# Patient Record
Sex: Female | Born: 1980 | Race: White | Hispanic: No | Marital: Single | Smoking: Current some day smoker
Health system: Southern US, Community
[De-identification: ages and names within clinical notes are randomized; demographics above are authoritative.]

## PROBLEM LIST (undated history)

## (undated) HISTORY — PX: CHOLECYSTECTOMY: SHX55

## (undated) HISTORY — PX: TUBAL LIGATION: SHX77

## (undated) HISTORY — PX: OTHER SURGICAL HISTORY: SHX169

## (undated) HISTORY — PX: COLONOSCOPY: SHX174

## (undated) HISTORY — PX: DILATION AND CURETTAGE OF UTERUS: SHX78

## (undated) HISTORY — PX: KNEE SURGERY: SHX244

## (undated) HISTORY — PX: APPENDECTOMY: SHX54

## (undated) HISTORY — PX: ESOPHAGOGASTRODUODENOSCOPY: SHX1529

---

## 2015-02-11 ENCOUNTER — Emergency Department (HOSPITAL_COMMUNITY): Payer: PRIVATE HEALTH INSURANCE

## 2015-02-11 ENCOUNTER — Emergency Department (HOSPITAL_COMMUNITY)
Admission: EM | Admit: 2015-02-11 | Discharge: 2015-02-11 | Disposition: A | Discharge status: Home / Self Care | Payer: PRIVATE HEALTH INSURANCE | Attending: Emergency Medicine | Admitting: Emergency Medicine

## 2015-02-11 ENCOUNTER — Encounter (HOSPITAL_COMMUNITY): Payer: Self-pay | Admitting: Emergency Medicine

## 2015-02-11 DIAGNOSIS — Y9389 Activity, other specified: Secondary | ICD-10-CM | POA: Diagnosis not present

## 2015-02-11 DIAGNOSIS — Y998 Other external cause status: Secondary | ICD-10-CM | POA: Insufficient documentation

## 2015-02-11 DIAGNOSIS — Y9289 Other specified places as the place of occurrence of the external cause: Secondary | ICD-10-CM | POA: Insufficient documentation

## 2015-02-11 DIAGNOSIS — S1093XA Contusion of unspecified part of neck, initial encounter: Secondary | ICD-10-CM | POA: Insufficient documentation

## 2015-02-11 DIAGNOSIS — S20211A Contusion of right front wall of thorax, initial encounter: Secondary | ICD-10-CM | POA: Insufficient documentation

## 2015-02-11 DIAGNOSIS — R0781 Pleurodynia: Secondary | ICD-10-CM

## 2015-02-11 DIAGNOSIS — T148XXA Other injury of unspecified body region, initial encounter: Secondary | ICD-10-CM

## 2015-02-11 DIAGNOSIS — S1091XA Abrasion of unspecified part of neck, initial encounter: Secondary | ICD-10-CM | POA: Diagnosis not present

## 2015-02-11 DIAGNOSIS — S0012XA Contusion of left eyelid and periocular area, initial encounter: Secondary | ICD-10-CM | POA: Insufficient documentation

## 2015-02-11 DIAGNOSIS — S299XXA Unspecified injury of thorax, initial encounter: Secondary | ICD-10-CM | POA: Diagnosis present
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
## 2015-02-11 MED ORDER — OXYCODONE-ACETAMINOPHEN 5-325 MG PO TABS: 1.0000 | ORAL_TABLET | ORAL | Status: DC | PRN

## 2015-02-11 MED ORDER — OXYCODONE-ACETAMINOPHEN 5-325 MG PO TABS
1.0000 | ORAL_TABLET | Freq: Once | ORAL | Status: AC
  Administered 2015-02-11: 1 via ORAL
  Filled 2015-02-11: qty 1
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
## 2015-02-11 NOTE — Discharge Instructions (Signed)
Abrasion An abrasion is a cut or scrape of the skin. Abrasions do not extend through all layers of the skin and most heal within 10 days. It is important to care for your abrasion properly to prevent infection. CAUSES  Most abrasions are caused by falling on, or gliding across, the ground or other surface. When your skin rubs on something, the outer and inner layer of skin rubs off, causing an abrasion. DIAGNOSIS  Your caregiver will be able to diagnose an abrasion during a physical exam.  TREATMENT  Your treatment depends on how large and deep the abrasion is. Generally, your abrasion will be cleaned with water and a mild soap to remove any dirt or debris. An antibiotic ointment may be put over the abrasion to prevent an infection. A bandage (dressing) may be wrapped around the abrasion to keep it from getting dirty.  You may need a tetanus shot if:  You cannot remember when you had your last tetanus shot.  You have never had a tetanus shot.  The injury broke your skin. If you get a tetanus shot, your arm may swell, get red, and feel warm to the touch. This is common and not a problem. If you need a tetanus shot and you choose not to have one, there is a rare chance of getting tetanus. Sickness from tetanus can be serious.  HOME CARE INSTRUCTIONS   If a dressing was applied, change it at least once a day or as directed by your caregiver. If the bandage sticks, soak it off with warm water.   Wash the area with water and a mild soap to remove all the ointment 2 times a day. Rinse off the soap and pat the area dry with a clean towel.   Reapply any ointment as directed by your caregiver. This will help prevent infection and keep the bandage from sticking. Use gauze over the wound and under the dressing to help keep the bandage from sticking.   Change your dressing right away if it becomes wet or dirty.   Only take over-the-counter or prescription medicines for pain, discomfort, or fever as  directed by your caregiver.   Follow up with your caregiver within 24-48 hours for a wound check, or as directed. If you were not given a wound-check appointment, look closely at your abrasion for redness, swelling, or pus. These are signs of infection. SEEK IMMEDIATE MEDICAL CARE IF:   You have increasing pain in the wound.   You have redness, swelling, or tenderness around the wound.   You have pus coming from the wound.   You have a fever or persistent symptoms for more than 2-3 days.  You have a fever and your symptoms suddenly get worse.  You have a bad smell coming from the wound or dressing.  MAKE SURE YOU:   Understand these instructions.  Will watch your condition.  Will get help right away if you are not doing well or get worse. Document Released: 07/15/2005 Document Revised: 09/21/2012 Document Reviewed: 09/08/2011 [facility] Patient Information 2015 [place], [place]. This information is not intended to replace advice given to you by your health care provider. Make sure you discuss any questions you have with your health care provider.  Chest Pain (Nonspecific) It is often hard to give a specific diagnosis for the cause of chest pain. There is always a chance that your pain could be related to something serious, such as a heart attack or a blood clot in the lungs. You  need to follow up with your health care provider for further evaluation. CAUSES   Heartburn.  Pneumonia or bronchitis.  Anxiety or stress.  Inflammation around your heart (pericarditis) or lung (pleuritis or pleurisy).  A blood clot in the lung.  A collapsed lung (pneumothorax). It can develop suddenly on its own (spontaneous pneumothorax) or from trauma to the chest.  Shingles infection (herpes zoster virus). The chest wall is composed of bones, muscles, and cartilage. Any of these can be the source of the pain.  The bones can be bruised by injury.  The muscles or cartilage can be strained by  coughing or overwork.  The cartilage can be affected by inflammation and become sore (costochondritis). DIAGNOSIS  Lab tests or other studies may be needed to find the cause of your pain. Your health care provider may have you take a test called an ambulatory electrocardiogram (ECG). An ECG records your heartbeat patterns over a 24-hour period. You may also have other tests, such as:  Transthoracic echocardiogram (TTE). During echocardiography, sound waves are used to evaluate how blood flows through your heart.  Transesophageal echocardiogram (TEE).  Cardiac monitoring. This allows your health care provider to monitor your heart rate and rhythm in real time.  Holter monitor. This is a portable device that records your heartbeat and can help diagnose heart arrhythmias. It allows your health care provider to track your heart activity for several days, if needed.  Stress tests by exercise or by giving medicine that makes the heart beat faster. TREATMENT   Treatment depends on what may be causing your chest pain. Treatment may include:  Acid blockers for heartburn.  Anti-inflammatory medicine.  Pain medicine for inflammatory conditions.  Antibiotics if an infection is present.  You may be advised to change lifestyle habits. This includes stopping smoking and avoiding alcohol, caffeine, and chocolate.  You may be advised to keep your head raised (elevated) when sleeping. This reduces the chance of acid going backward from your stomach into your esophagus. Most of the time, nonspecific chest pain will improve within 2-3 days with rest and mild pain medicine.  HOME CARE INSTRUCTIONS   If antibiotics were prescribed, take them as directed. Finish them even if you start to feel better.  For the next few days, avoid physical activities that bring on chest pain. Continue physical activities as directed.  Do not use any tobacco products, including cigarettes, chewing tobacco, or electronic  cigarettes.  Avoid drinking alcohol.  Only take medicine as directed by your health care provider.  Follow your health care provider's suggestions for further testing if your chest pain does not go away.  Keep any follow-up appointments you made. If you do not go to an appointment, you could develop lasting (chronic) problems with pain. If there is any problem keeping an appointment, call to reschedule. SEEK MEDICAL CARE IF:   Your chest pain does not go away, even after treatment.  You have a rash with blisters on your chest.  You have a fever. SEEK IMMEDIATE MEDICAL CARE IF:   You have increased chest pain or pain that spreads to your arm, neck, jaw, back, or abdomen.  You have shortness of breath.  You have an increasing cough, or you cough up blood.  You have severe back or abdominal pain.  You feel nauseous or vomit.  You have severe weakness.  You faint.  You have chills. This is an emergency. Do not wait to see if the pain will go away. Get  medical help at once. Call your local emergency services (911 in [place] Do not drive yourself to the hospital. MAKE SURE YOU:   Understand these instructions.  Will watch your condition.  Will get help right away if you are not doing well or get worse. Document Released: 07/15/2005 Document Revised: 10/10/2013 Document Reviewed: 05/10/2008 [facility] Patient Information 2015 [place]. This information is not intended to replace advice given to you by your health care provider. Make sure you discuss any questions you have with your health care provider.
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
## 2021-10-24 ENCOUNTER — Emergency Department: Payer: BLUE CROSS/BLUE SHIELD

## 2021-10-24 ENCOUNTER — Inpatient Hospital Stay
Admission: EM | Admit: 2021-10-24 | Discharge: 2021-10-26 | DRG: 069 | Disposition: A | Discharge status: Home / Self Care | Payer: BLUE CROSS/BLUE SHIELD | Attending: Internal Medicine | Admitting: Internal Medicine

## 2021-10-24 ENCOUNTER — Encounter: Payer: Self-pay | Admitting: Emergency Medicine

## 2021-10-24 ENCOUNTER — Other Ambulatory Visit: Payer: Self-pay

## 2021-10-24 DIAGNOSIS — R2990 Unspecified symptoms and signs involving the nervous system: Secondary | ICD-10-CM | POA: Diagnosis not present

## 2021-10-24 DIAGNOSIS — R29702 NIHSS score 2: Secondary | ICD-10-CM | POA: Diagnosis present

## 2021-10-24 DIAGNOSIS — I959 Hypotension, unspecified: Secondary | ICD-10-CM | POA: Diagnosis not present

## 2021-10-24 DIAGNOSIS — G459 Transient cerebral ischemic attack, unspecified: Secondary | ICD-10-CM | POA: Diagnosis not present

## 2021-10-24 DIAGNOSIS — R202 Paresthesia of skin: Secondary | ICD-10-CM | POA: Diagnosis not present

## 2021-10-24 DIAGNOSIS — Z720 Tobacco use: Secondary | ICD-10-CM | POA: Diagnosis present

## 2021-10-24 DIAGNOSIS — Z6841 Body Mass Index (BMI) 40.0 and over, adult: Secondary | ICD-10-CM

## 2021-10-24 DIAGNOSIS — Z79899 Other long term (current) drug therapy: Secondary | ICD-10-CM

## 2021-10-24 DIAGNOSIS — I10 Essential (primary) hypertension: Secondary | ICD-10-CM | POA: Diagnosis present

## 2021-10-24 DIAGNOSIS — R29810 Facial weakness: Secondary | ICD-10-CM

## 2021-10-24 DIAGNOSIS — Z8616 Personal history of COVID-19: Secondary | ICD-10-CM

## 2021-10-24 DIAGNOSIS — R200 Anesthesia of skin: Secondary | ICD-10-CM | POA: Diagnosis present

## 2021-10-24 DIAGNOSIS — Z833 Family history of diabetes mellitus: Secondary | ICD-10-CM

## 2021-10-24 DIAGNOSIS — G510 Bell's palsy: Secondary | ICD-10-CM | POA: Diagnosis present

## 2021-10-24 DIAGNOSIS — Z882 Allergy status to sulfonamides status: Secondary | ICD-10-CM

## 2021-10-24 DIAGNOSIS — Z8249 Family history of ischemic heart disease and other diseases of the circulatory system: Secondary | ICD-10-CM

## 2021-10-24 DIAGNOSIS — Z888 Allergy status to other drugs, medicaments and biological substances status: Secondary | ICD-10-CM

## 2021-10-24 DIAGNOSIS — E66813 Obesity, class 3: Secondary | ICD-10-CM

## 2021-10-24 DIAGNOSIS — F17210 Nicotine dependence, cigarettes, uncomplicated: Secondary | ICD-10-CM | POA: Diagnosis present

## 2021-10-24 LAB — CBC
HCT: 40.4 % (ref 36.0–46.0)
Hemoglobin: 13.1 g/dL (ref 12.0–15.0)
MCH: 30.3 pg (ref 26.0–34.0)
MCHC: 32.4 g/dL (ref 30.0–36.0)
MCV: 93.5 fL (ref 80.0–100.0)
Platelets: 292 10*3/uL (ref 150–400)
RBC: 4.32 MIL/uL (ref 3.87–5.11)
RDW: 12.5 % (ref 11.5–15.5)
WBC: 9.6 10*3/uL (ref 4.0–10.5)
nRBC: 0 % (ref 0.0–0.2)

## 2021-10-24 LAB — COMPREHENSIVE METABOLIC PANEL
ALT: 26 U/L (ref 0–44)
AST: 22 U/L (ref 15–41)
Albumin: 3.9 g/dL (ref 3.5–5.0)
Alkaline Phosphatase: 58 U/L (ref 38–126)
Anion gap: 8 (ref 5–15)
BUN: 15 mg/dL (ref 6–20)
CO2: 19 mmol/L — ABNORMAL LOW (ref 22–32)
Calcium: 8.7 mg/dL — ABNORMAL LOW (ref 8.9–10.3)
Chloride: 108 mmol/L (ref 98–111)
Creatinine, Ser: 0.81 mg/dL (ref 0.44–1.00)
GFR, Estimated: >60 mL/min (ref >60)
Glucose, Bld: 108 mg/dL — ABNORMAL HIGH (ref 70–99)
Potassium: 3.4 mmol/L — ABNORMAL LOW (ref 3.5–5.1)
Sodium: 135 mmol/L (ref 135–145)
Total Bilirubin: 0.5 mg/dL (ref 0.3–1.2)
Total Protein: 7.2 g/dL (ref 6.5–8.1)

## 2021-10-24 LAB — PROTIME-INR
INR: 1 (ref 0.8–1.2)
Prothrombin Time: 13.6 seconds (ref 11.4–15.2)

## 2021-10-24 LAB — DIFFERENTIAL
Abs Immature Granulocytes: 0.07 10*3/uL (ref 0.00–0.07)
Basophils Absolute: 0 10*3/uL (ref 0.0–0.1)
Basophils Relative: 0 %
Eosinophils Absolute: 0.1 10*3/uL (ref 0.0–0.5)
Eosinophils Relative: 1 %
Immature Granulocytes: 1 %
Lymphocytes Relative: 36 %
Lymphs Abs: 3.5 10*3/uL (ref 0.7–4.0)
Monocytes Absolute: 0.7 10*3/uL (ref 0.1–1.0)
Monocytes Relative: 8 %
Neutro Abs: 5.2 10*3/uL (ref 1.7–7.7)
Neutrophils Relative %: 54 %

## 2021-10-24 LAB — APTT: aPTT: 24 seconds (ref 24–36)

## 2021-10-24 LAB — CBG MONITORING, ED: Glucose-Capillary: 107 mg/dL — ABNORMAL HIGH (ref 70–99)

## 2021-10-24 IMAGING — CT CT HEAD CODE STROKE
4 series · 17 of 47 positions shown, 19 images · non-contrast
Comparison: None.

CLINICAL DATA: Code stroke.  Facial droop

EXAM:
CT HEAD WITHOUT CONTRAST
TECHNIQUE: Contiguous axial images were obtained from the base of the skull
through the vertex without intravenous contrast.

[Series 4: head wo · axial · 0.47mm/px · z∈[+402,+522]mm · 7 of 34 slices shown, 9 images]
[im 5/34  brain]
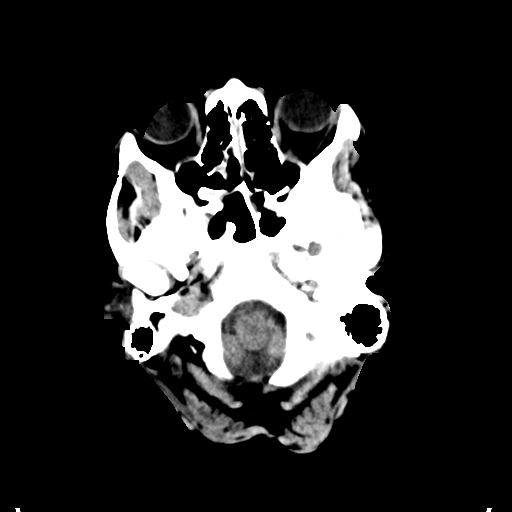
[im 5/34  bone]
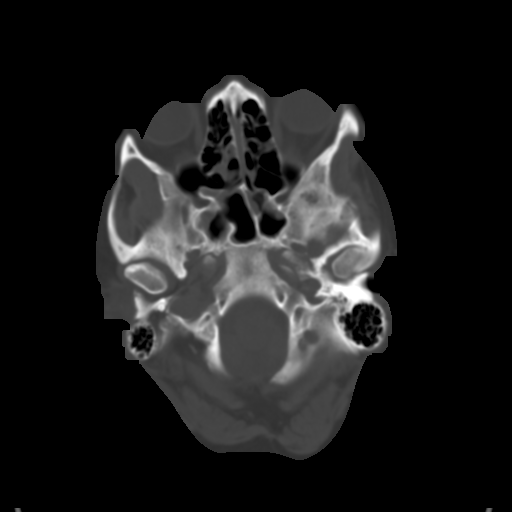
[im 9/34  brain]
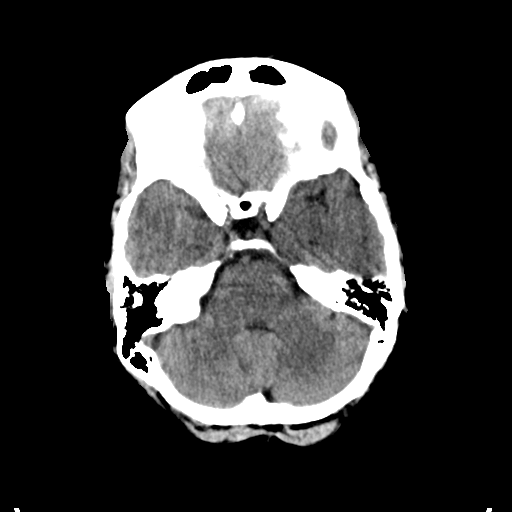
[im 13/34  brain]
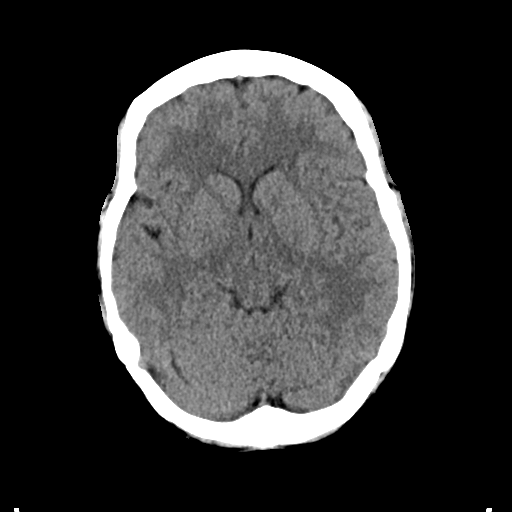
[im 17/34  brain]
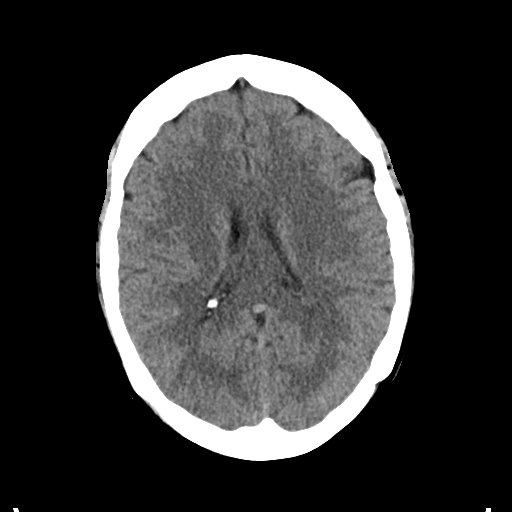
[im 21/34  brain]
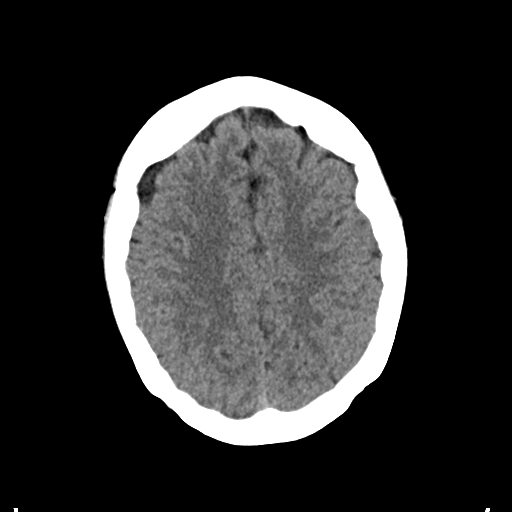
[im 21/34  bone]
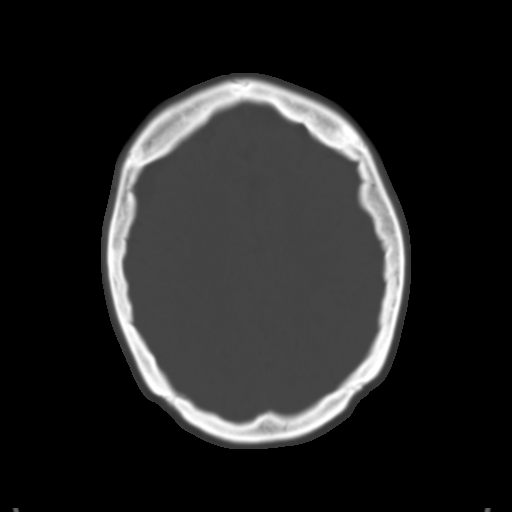
[im 25/34  brain]
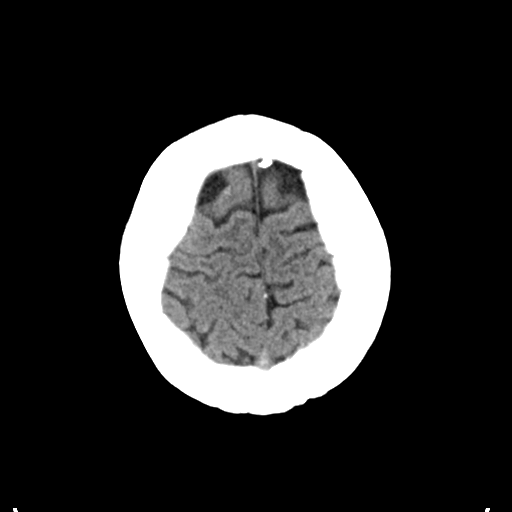
[im 29/34  brain]
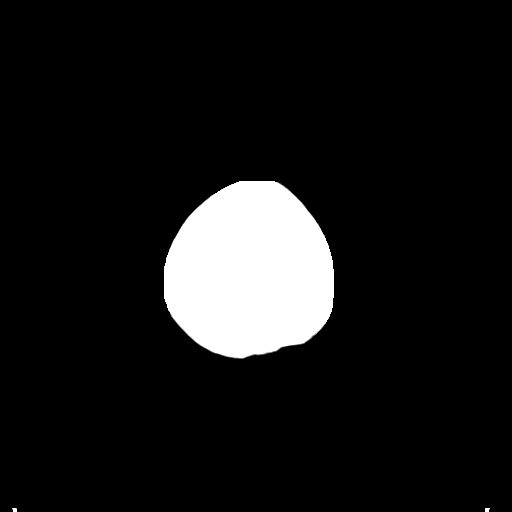

[Series 5: head bone · axial · 0.47mm/px · z∈[+398,+454]mm · 4 of 83 slices shown]
[im 9/83  bone]
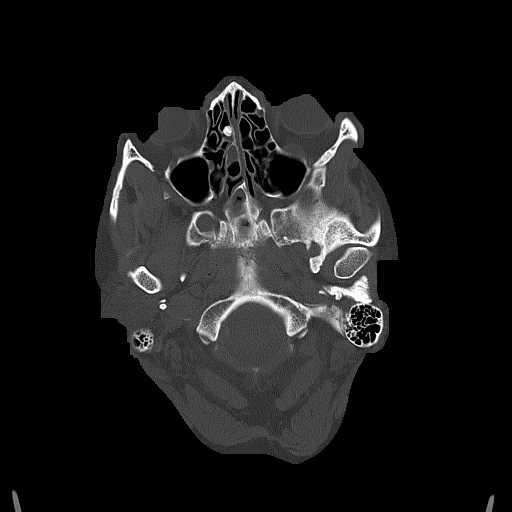
[im 17/83  bone]
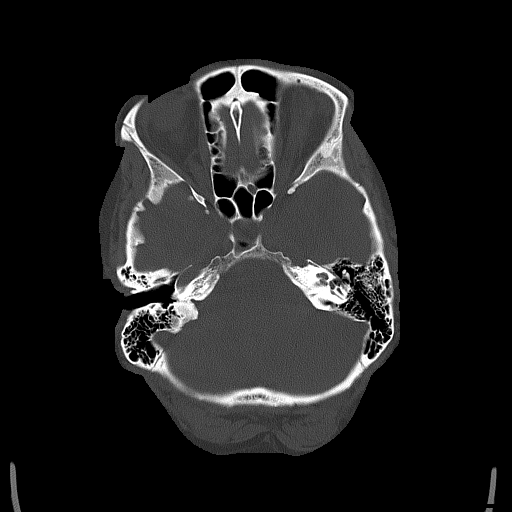
[im 25/83  bone]
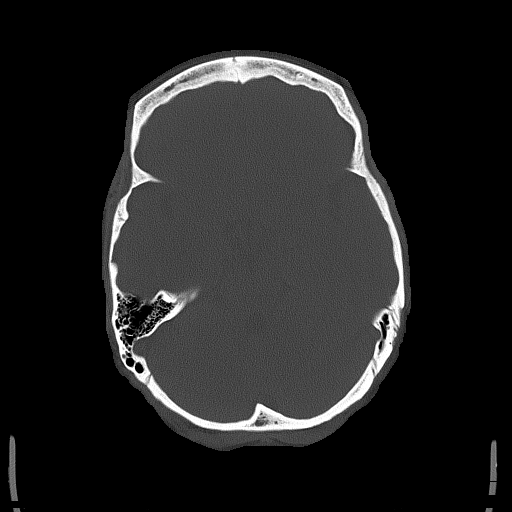
[im 37/83  bone]
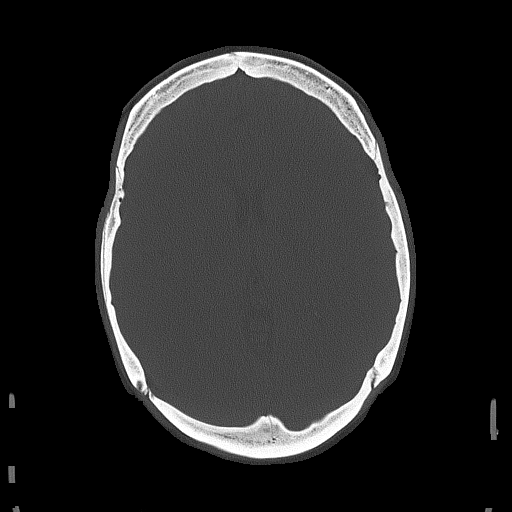

[Series 6: coronal soft tissue · coronal · 0.34mm/px · 3 of 70 slices shown]
[im 24/70  brain]
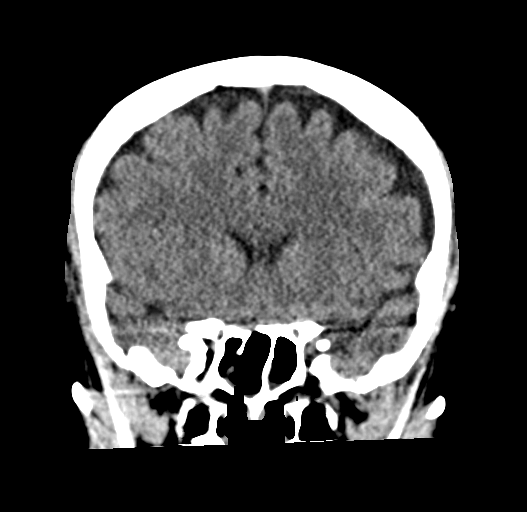
[im 31/70  brain]
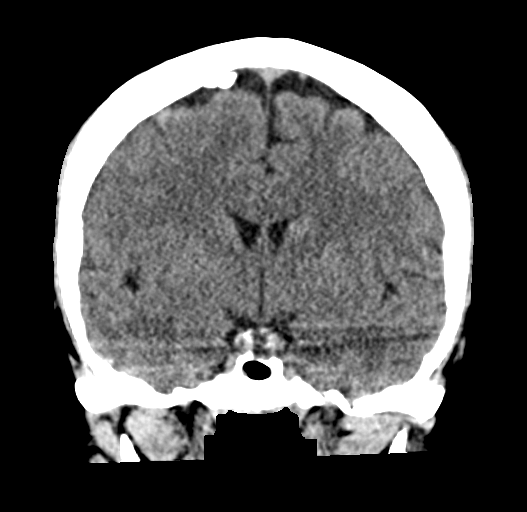
[im 39/70  brain]
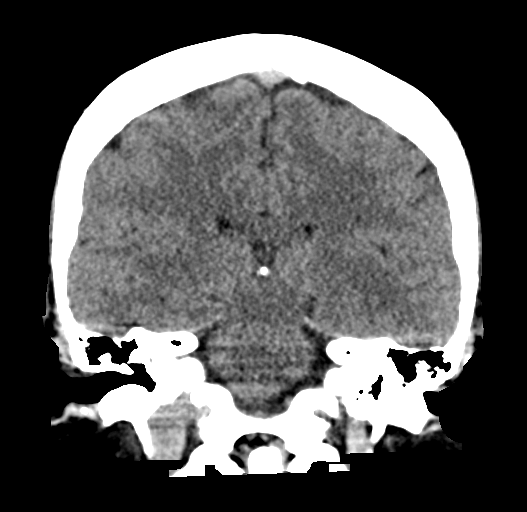

[Series 7: sagittal soft tissue · sagittal · 0.34mm/px · 3 of 60 slices shown]
[im 20/60  brain]
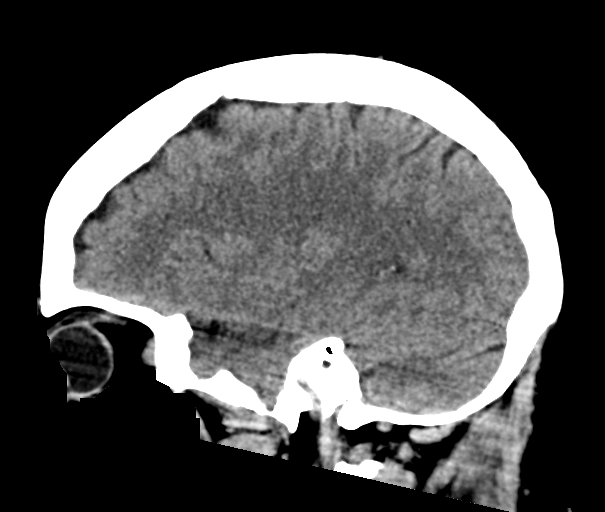
[im 30/60  brain]
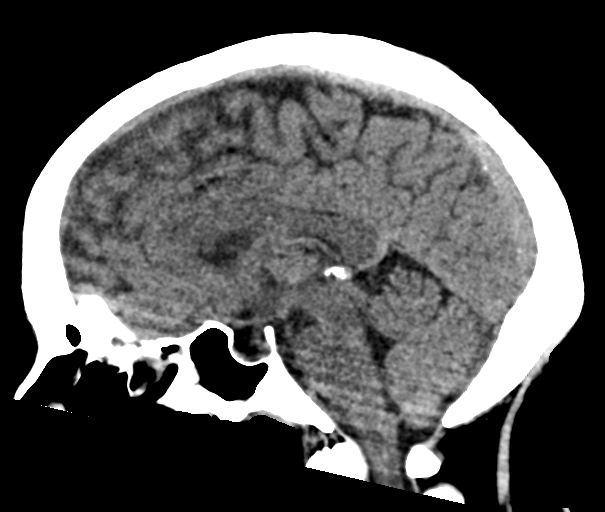
[im 40/60  brain]
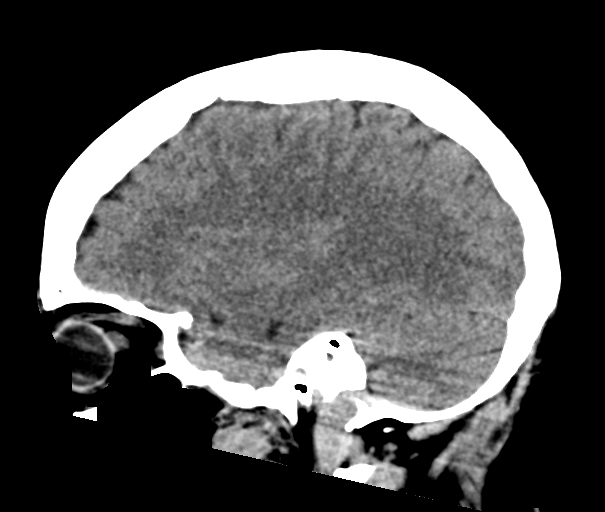

[17 of 47 positions shown; findings below may reference images not displayed]

FINDINGS: Brain: There is no mass, hemorrhage or extra-axial collection. The
size and configuration of the ventricles and extra-axial CSF spaces
are normal. The brain parenchyma is normal, without evidence of
acute or chronic infarction.

Vascular: No abnormal hyperdensity of the major intracranial
arteries or dural venous sinuses. No intracranial atherosclerosis.

Skull: The visualized skull base, calvarium and extracranial soft
tissues are normal.

Sinuses/Orbits: No fluid levels or advanced mucosal thickening of
the visualized paranasal sinuses. No mastoid or middle ear effusion.
The orbits are normal.

ASPECTS (Alberta Stroke Program Early CT Score)

- Ganglionic level infarction (caudate, lentiform nuclei, internal
capsule, insula, M1-M3 cortex): 7

- Supraganglionic infarction (M4-M6 cortex): 3

Total score (0-10 with 10 being normal): 10
IMPRESSION: 1. Normal head CT.
2. ASPECTS is 10.

These results were called by telephone at the time of interpretation
on [DATE] at [DATE] to provider ELUOZO , who verbally
acknowledged these results.

## 2021-10-24 MED ORDER — ASPIRIN 81 MG PO CHEW
324.0000 mg | CHEWABLE_TABLET | Freq: Once | ORAL | Status: AC
  Administered 2021-10-25: 324 mg via ORAL
  Filled 2021-10-24: qty 4

## 2021-10-24 NOTE — H&P (Signed)
History and Physical    Kayla [name] [id number] DOB: [birth year] DOA: 10/24/2021  PCP: Dian Situ, MD    Patient coming from:  Home   Chief Complaint:  Right-sided  facial numbness  - Code stroke.   HPI:  Kayla [name] is a 41 y.o. female seen in ed with complaints of right-sided facial  paresthesia that started about 8:30 PM today.  Patient also complains of a headache and had Covid positive on 1225.the heaviness affected rt eye and rt upper and lower face. It has been constant since it started. No aggravating or alleviating factors. D/w pt about  tobacco cessation. It is constant not intermittent. No associated symptoms. No weakness.  Patient denies any blurry vision dizziness chest pain palpitation shortness of breath gait speech or difficulty ambulating.  Headache currently is 6 out of 10 and patient has taken Tylenol for it and has improved.   Pt has past medical history of tobacco abuse, hypertension, allergy to sulfa and Levaquin.  ED Course:   Vitals:   10/24/21 2212 10/24/21 2230 10/24/21 2300 10/24/21 2330  BP:  (!) 119/93 (!) 146/103 (!) 134/94  Pulse:  82 91 80  Resp:  19 16 20   Temp: 98.3 F (36.8 C)     TempSrc: Oral     SpO2:  99% 99% 100%  Weight: 121.1 kg     Height: 5\' 8"  (1.727 m)     In the emergency room patient is alert awake oriented vitals are stable, patient received full dose aspirin.  Head CT negative for any acute findings.  Review of Systems:  Review of Systems  Neurological:  Positive for sensory change and headaches.  All other systems reviewed and are negative.   History reviewed. No pertinent past medical history.  Past Surgical History:  Procedure Laterality Date   APPENDECTOMY     CHOLECYSTECTOMY     COLONOSCOPY     DILATION AND CURETTAGE OF UTERUS     ESOPHAGOGASTRODUODENOSCOPY     KNEE SURGERY     TUBAL LIGATION     uterine ablation       reports that she has been smoking cigarettes. She does not have any smokeless tobacco  history on file. She reports current alcohol use. No history on file for drug use.  Allergies  Allergen Reactions   Levaquin [Levofloxacin] Other (See Comments)    paranoid   Sulfa Antibiotics Rash    Family History  Problem Relation Age of Onset   Diabetes Mother    Hypertension Mother     Prior to Admission medications   Medication Sig Start Date End Date Taking? Authorizing Provider  acetaminophen (TYLENOL) 500 MG tablet Take 1,000 mg by mouth every 8 (eight) hours as needed for mild pain.   Yes [provider]  albuterol (VENTOLIN HFA) 108 (90 Base) MCG/ACT inhaler Inhale 2 puffs into the lungs every 6 (six) hours as needed for wheezing or shortness of breath.   Yes [provider]  ibuprofen (ADVIL) 200 MG tablet Take 800 mg by mouth every 6 (six) hours as needed for moderate pain.   Yes [provider]  meclizine (ANTIVERT) 25 MG tablet Take 25 mg by mouth 3 (three) times daily as needed for dizziness. 10/15/21 10/15/22 Yes [provider]  hydrochlorothiazide (HYDRODIURIL) 25 MG tablet Take 25 mg by mouth daily. Patient not taking: Reported on 10/24/2021    [provider]  lisinopril (ZESTRIL) 20 MG tablet Take 20 mg by mouth daily. Patient not  taking: Reported on 10/24/2021    [provider]  Nirmatrelvir-Ritonavir (PAXLOVID, 300/100, PO) Take 3 tablets by mouth as directed. Patient not taking: Reported on 10/24/2021 10/14/21   [provider]  oxyCODONE-acetaminophen (PERCOCET/ROXICET) 5-325 MG per tablet Take 1-2 tablets by mouth every 4 (four) hours as needed for severe pain. Patient not taking: Reported on 10/24/2021 02/11/15   Glendell Docker, NP    Physical Exam: Vitals:   10/24/21 2212 10/24/21 2230 10/24/21 2300 10/24/21 2330  BP:  (!) 119/93 (!) 146/103 (!) 134/94  Pulse:  82 91 80  Resp:  19 16 20   Temp: 98.3 F (36.8 C)     TempSrc: Oral     SpO2:  99% 99% 100%  Weight: 121.1 kg     Height: 5\' 8"   (1.727 m)      Physical Exam Vitals reviewed.  Constitutional:      General: She is not in acute distress.    Appearance: She is not ill-appearing.  HENT:     Head: Normocephalic and atraumatic. No right periorbital erythema or left periorbital erythema.      Right Ear: Hearing, tympanic membrane, ear canal and external ear normal. No decreased hearing noted. No drainage, swelling or tenderness. No middle ear effusion. There is no impacted cerumen. No foreign body. No PE tube. No hemotympanum. Tympanic membrane is not injected or scarred.     Left Ear: Hearing, tympanic membrane, ear canal and external ear normal. No decreased hearing noted. No drainage, swelling or tenderness.  No middle ear effusion. There is no impacted cerumen. No foreign body. No PE tube. No hemotympanum. Tympanic membrane is not injected or scarred.     Nose: Nose normal.     Mouth/Throat:     Mouth: Mucous membranes are moist.     Tongue: No lesions. Tongue does not deviate from midline.  Eyes:     Extraocular Movements: Extraocular movements intact.     Pupils: Pupils are equal, round, and reactive to light.  Neck:     Vascular: No carotid bruit.  Cardiovascular:     Rate and Rhythm: Normal rate and regular rhythm.     Pulses: Normal pulses.     Heart sounds: Normal heart sounds.  Pulmonary:     Effort: Pulmonary effort is normal.     Breath sounds: Normal breath sounds.  Abdominal:     General: Bowel sounds are normal. There is no distension.     Palpations: Abdomen is soft. There is no mass.     Tenderness: There is no abdominal tenderness. There is no guarding.     Hernia: No hernia is present.  Musculoskeletal:     Right lower leg: No edema.     Left lower leg: No edema.  Skin:    General: Skin is warm.  Neurological:     General: No focal deficit present.     Mental Status: She is alert and oriented to person, place, and time.  Psychiatric:        Mood and Affect: Mood normal.        Behavior:  Behavior normal.    Labs on Admission: I have personally reviewed following labs and imaging studies  No results for input(s): CKTOTAL, CKMB, TROPONINI in the last 72 hours. Lab Results  Component Value Date   WBC 9.6 10/24/2021   HGB 13.1 10/24/2021   HCT 40.4 10/24/2021   MCV 93.5 10/24/2021   PLT 292 10/24/2021    Recent Labs  Lab 10/24/21 2153  NA 135  K 3.4*  CL 108  CO2 19*  BUN 15  CREATININE 0.81  CALCIUM 8.7*  PROT 7.2  BILITOT 0.5  ALKPHOS 58  ALT 26  AST 22  GLUCOSE 108*   No results found for: CHOL, HDL, LDLCALC, TRIG No results found for: DDIMER Invalid input(s): POCBNP   COVID-19 Labs No results for input(s): DDIMER, FERRITIN, LDH, CRP in the last 72 hours. Lab Results  Component Value Date   SARSCOV2NAA POSITIVE (A) 10/24/2021    Radiological Exams on Admission: CT HEAD CODE STROKE WO CONTRAST  Result Date: 10/24/2021 CLINICAL DATA:  Code stroke.  Facial droop EXAM: CT HEAD WITHOUT CONTRAST TECHNIQUE: Contiguous axial images were obtained from the base of the skull through the vertex without intravenous contrast. COMPARISON:  None. FINDINGS: Brain: There is no mass, hemorrhage or extra-axial collection. The size and configuration of the ventricles and extra-axial CSF spaces are normal. The brain parenchyma is normal, without evidence of acute or chronic infarction. Vascular: No abnormal hyperdensity of the major intracranial arteries or dural venous sinuses. No intracranial atherosclerosis. Skull: The visualized skull base, calvarium and extracranial soft tissues are normal. Sinuses/Orbits: No fluid levels or advanced mucosal thickening of the visualized paranasal sinuses. No mastoid or middle ear effusion. The orbits are normal. ASPECTS [facility] Stroke Program Early CT Score) - Ganglionic level infarction (caudate, lentiform nuclei, internal capsule, insula, M1-M3 cortex): 7 - Supraganglionic infarction (M4-M6 cortex): 3 Total score (0-10 with 10 being  normal): 10 IMPRESSION: 1. Normal head CT. 2. ASPECTS is 10. These results were called by telephone at the time of interpretation on 10/24/2021 at 9:51 pm to provider PHILLIP STAFFORD , who verbally acknowledged these results. Electronically Signed   By: Deatra Robinson M.D.   On: 10/24/2021 21:51    EKG: Independently reviewed.  Sinus rhythm 83 with normal axis.   Assessment/Plan: Principal Problem:   Facial paresthesia Active Problems:   Essential hypertension   Tobacco abuse   Facial paresthesia: -We will admit pt to med tele floor.  -We will continue asa 325 mg and statin.  -Pt denies any weakness , and no droop on exam currently.  -Per neurology consult recommendations: -MRI brain Noncontrast.  -Neurochecks, bedside swallow, aspiration, fall precautions. -Aspirin 325 milligrams daily. -Lipitor 80 mg x 1. -PT/OT/ Bedside swallow.  -Permissive HTN-SBP 220/ 120's ,labetalol PO or IV or Vasotec IV with a goal of 15% reduction in BP during the first 24 hours. - a1c and thyroid studies, am lipid panel.   HTN: D/w pt about permissive htn and need for NO BP MEDS WITH GOAL OF SBP TO BE IN 200'S.   Tobacco abuse: Nicotine patch.  Counseled pt on tobacco cessation.     DVT prophylaxis:  Heparin  Code Status:  Full code  Family Communication:  Vidette, Wilkinson (Mother)  [phone] (Home Phone)   Disposition Plan:  To be determined  Consults called:  Neurology  Admission status: Inpatient   Gertha Calkin MD [facility]  6 PM- 2 AM. Please contact me via secure Chat 6 PM-2 AM. How to contact the [facility] Attending or Consulting provider 7A - 7P or covering provider during after hours 7P -7A, for this patient.   Check the care team in [facility] and look for a) attending/consulting [facility] provider listed and b) the [facility] team listed Log into [email or web address] and use [place]'s universal password to access. If you do not have the password, please contact the hospital  operator. Locate the [facility] provider you are looking for under [facility] and page to a number that you can be directly reached. If you still have difficulty reaching the provider, please page the [facility] (Director on Call) for the Hospitalists listed on amion for assistance. [email or web address] 10/25/2021, 1:20 AM

## 2021-10-24 NOTE — ED Triage Notes (Signed)
Pt c/o right sided facial droop and numbness. Pt c/o headache today. Covid + 12/25

## 2021-10-24 NOTE — ED Notes (Signed)
CALLED CARELINK  (RUBY) FOR CODE STROKE

## 2021-10-24 NOTE — ED Notes (Addendum)
Pt reported teeth and mouth are hurting - neurologist RN on tele-cart and neurologist informed.

## 2021-10-24 NOTE — Progress Notes (Addendum)
°   10/24/21 2147  Clinical Encounter Type  Visited With Health care provider   Chaplain Burris called ED desk to check-in regarding plan of care and to assess family presence. Will f/u with Pt post-CT.

## 2021-10-24 NOTE — ED Provider Notes (Signed)
[facility] Provider Note    Event Date/Time   First MD Initiated Contact with Patient 10/24/21 2200     (approximate)   History   Code Stroke   HPI  Juley Hopson is a 41 y.o. female who reports sudden onset of right-sided facial weakness and paresthesia that started at 8:30 PM tonight.  It is been constant since then.  No recent trauma.  She did recently have COVID 2 weeks ago and has had frequent recurrent generalized headaches since then.  Denies blurry vision no double vision.  Denies any extremity weakness or paresthesia.     Physical Exam   Triage Vital Signs: ED Triage Vitals [10/24/21 2140]  Enc Vitals Group     BP (!) 165/108     Pulse Rate (!) 114     Resp 18     Temp      Temp src      SpO2 97 %     Weight      Height      Head Circumference      Peak Flow      Pain Score      Pain Loc      Pain Edu?      Excl. in [place]?     Most recent vital signs: Vitals:   10/24/21 2212 10/24/21 2230  BP:  (!) 119/93  Pulse:  82  Resp:  19  Temp: 98.3 F (36.8 C)   SpO2:  99%     General: Awake, no distress.  CV:  Good peripheral perfusion.  Regular rate and rhythm Resp:  Normal effort.  Abd:  No distention.  Other:  There is droop of the right corner of the mouth at rest but symmetric smile.  Symmetric eyebrow raise.  Symmetric eye closure.  No extremity drift.   ED Results / Procedures / Treatments   Labs (all labs ordered are listed, but only abnormal results are displayed) Labs Reviewed  COMPREHENSIVE METABOLIC PANEL - Abnormal; Notable for the following components:      Result Value   Potassium 3.4 (*)    CO2 19 (*)    Glucose, Bld 108 (*)    Calcium 8.7 (*)    All other components within normal limits  CBG MONITORING, ED - Abnormal; Notable for the following components:   Glucose-Capillary 107 (*)    All other components within normal limits  RESP PANEL BY RT-PCR (FLU A&B, COVID) ARPGX2  PROTIME-INR  APTT  CBC   DIFFERENTIAL  URINE DRUG SCREEN, QUALITATIVE ([facility] ONLY)  URINALYSIS, ROUTINE W REFLEX MICROSCOPIC  ETHANOL  POC URINE PREG, ED     EKG  Interpreted by me Normal sinus rhythm rate of 83.  Normal axis and intervals.  Normal QRS ST segments and T waves.   RADIOLOGY CT head viewed and interpreted by me, no intracranial hemorrhage.  Radiology report reviewed.  Findings discussed with radiologist as well.    PROCEDURES:  Critical Care performed: No  Procedures   MEDICATIONS ORDERED IN ED: Medications - No data to display   IMPRESSION / MDM / [place] / ED COURSE  I reviewed the triage vital signs and the nursing notes.                              Differential diagnosis includes, but is not limited to, intracranial hemorrhage, ischemic stroke, mild Bell's palsy, electrolyte abnormality  Patient presents with right facial weakness and paresthesia for the past 1.5 hours.  I feel that she is not a tPA candidate due to low NIH stroke scale score.  Will obtain stat teleneurology consult.  Clinical Course as of 10/24/21 2317  Ludwig Clarks Oct 24, 2021  2204 Fingerstick blood glucose is normal. [PS]    Clinical Course User Index [PS] Carrie Mew, MD    ----------------------------------------- 11:16 PM on 10/24/2021 ----------------------------------------- CBC is normal with normal hemoglobin.  Chemistry panel is unremarkable.  Presenting symptoms discussed with neurology who has evaluated the patient.  Currently awaiting their recommendations.   FINAL CLINICAL IMPRESSION(S) / ED DIAGNOSES   Final diagnoses:  Facial weakness     Rx / DC Orders   ED Discharge Orders     None        Note:  This document was prepared using Dragon voice recognition software and may include unintentional dictation errors.   Carrie Mew, MD 10/24/21 2317

## 2021-10-24 NOTE — ED Notes (Addendum)
Neurologist on telecart (Dr. Fayrene Helper) to examine patient.

## 2021-10-24 NOTE — ED Notes (Addendum)
Tele neuro cart in room and alerted by RN

## 2021-10-24 NOTE — ED Provider Notes (Signed)
----------------------------------------- °  11:13 PM on 10/24/2021 -----------------------------------------  Assuming care from Dr. Joni Fears.  In short, Kayla [name] is a 40 y.o. female with a chief complaint of facial numbness/droop.  Refer to the original H&P for additional details.  The current plan of care is to follow up with teleneurology.  Workup thus far has been reassuring, but not clear explanation of symptoms yet exists.   ----------------------------------------- 11:48 PM on 10/24/2021 -----------------------------------------  I discussed the case by phone with the telemetry neurologist.  She is having computer problems and is working with [facility] [employer] support, but currently she cannot put in a note.  However she explained to me that after evaluating the patient, she is concerned about the presence of central facial numbness/droop as well as the numbness of the right arm and leg.  She recommends a full dose aspirin and admission for full stroke work-up.  No additional ED interventions at this time.  I am consulting the hospitalist service for admission.  I also personally discussed the case and the plan with the patient.  She is awake, alert, oriented, and reports that her face feels "funny" and is still feeling numb but she has had no significant change in her symptoms.   ----------------------------------------- 12:12 AM on 10/25/2021 -----------------------------------------  Spoke by phone with Dr. Florina Ou with the hospitalist service.  We discussed the case and she agreed to admit the patient for further stroke workup as recommended by neurologist.   Hinda Kehr, MD 10/25/21 [phone]

## 2021-10-24 NOTE — ED Notes (Addendum)
Last normal approx. 8:30/9pm per patient. No dizziness or blurry vision reported. Patient reports has had headache since about 2 pm and headache pain level is 6/10. She describes it as "all over." Pt took Tylenol around 8 pm for headache and has improved. No numbness or weakness reported in extremities. Patient has hx: HTN and has not been on medications since approx 06/2021 due to change in insurance. Pt is a smoker and drinks rarely.  No previous neurologic activity or strokes.  Patient previously Covid positive 10/12/2021.

## 2021-10-24 NOTE — ED Notes (Signed)
Consulting civil engineer and ED secretary informed calling CODE STROKE on pt

## 2021-10-25 ENCOUNTER — Inpatient Hospital Stay: Payer: BLUE CROSS/BLUE SHIELD

## 2021-10-25 ENCOUNTER — Encounter: Payer: Self-pay | Admitting: Internal Medicine

## 2021-10-25 ENCOUNTER — Inpatient Hospital Stay (HOSPITAL_COMMUNITY)
Admit: 2021-10-25 | Discharge: 2021-10-25 | Disposition: A | Discharge status: Home / Self Care | Payer: BLUE CROSS/BLUE SHIELD | Attending: Internal Medicine | Admitting: Internal Medicine

## 2021-10-25 DIAGNOSIS — Z79899 Other long term (current) drug therapy: Secondary | ICD-10-CM | POA: Diagnosis not present

## 2021-10-25 DIAGNOSIS — R29702 NIHSS score 2: Secondary | ICD-10-CM | POA: Diagnosis present

## 2021-10-25 DIAGNOSIS — I959 Hypotension, unspecified: Secondary | ICD-10-CM | POA: Diagnosis not present

## 2021-10-25 DIAGNOSIS — I10 Essential (primary) hypertension: Secondary | ICD-10-CM | POA: Diagnosis present

## 2021-10-25 DIAGNOSIS — G459 Transient cerebral ischemic attack, unspecified: Secondary | ICD-10-CM

## 2021-10-25 DIAGNOSIS — Z833 Family history of diabetes mellitus: Secondary | ICD-10-CM | POA: Diagnosis not present

## 2021-10-25 DIAGNOSIS — Z8249 Family history of ischemic heart disease and other diseases of the circulatory system: Secondary | ICD-10-CM | POA: Diagnosis not present

## 2021-10-25 DIAGNOSIS — R200 Anesthesia of skin: Secondary | ICD-10-CM | POA: Diagnosis present

## 2021-10-25 DIAGNOSIS — E236 Other disorders of pituitary gland: Secondary | ICD-10-CM | POA: Insufficient documentation

## 2021-10-25 DIAGNOSIS — R202 Paresthesia of skin: Secondary | ICD-10-CM | POA: Diagnosis present

## 2021-10-25 DIAGNOSIS — Z720 Tobacco use: Secondary | ICD-10-CM | POA: Diagnosis present

## 2021-10-25 DIAGNOSIS — Z888 Allergy status to other drugs, medicaments and biological substances status: Secondary | ICD-10-CM | POA: Diagnosis not present

## 2021-10-25 DIAGNOSIS — Z8616 Personal history of COVID-19: Secondary | ICD-10-CM | POA: Diagnosis not present

## 2021-10-25 DIAGNOSIS — R2990 Unspecified symptoms and signs involving the nervous system: Secondary | ICD-10-CM | POA: Diagnosis present

## 2021-10-25 DIAGNOSIS — G510 Bell's palsy: Secondary | ICD-10-CM | POA: Diagnosis present

## 2021-10-25 DIAGNOSIS — Z6841 Body Mass Index (BMI) 40.0 and over, adult: Secondary | ICD-10-CM | POA: Diagnosis not present

## 2021-10-25 DIAGNOSIS — Z882 Allergy status to sulfonamides status: Secondary | ICD-10-CM | POA: Diagnosis not present

## 2021-10-25 DIAGNOSIS — R29810 Facial weakness: Secondary | ICD-10-CM | POA: Insufficient documentation

## 2021-10-25 DIAGNOSIS — F17210 Nicotine dependence, cigarettes, uncomplicated: Secondary | ICD-10-CM | POA: Diagnosis present

## 2021-10-25 LAB — T4, FREE: Free T4: 0.78 ng/dL (ref 0.61–1.12)

## 2021-10-25 LAB — RESP PANEL BY RT-PCR (FLU A&B, COVID) ARPGX2
Influenza A by PCR: NEGATIVE
Influenza B by PCR: NEGATIVE
SARS Coronavirus 2 by RT PCR: POSITIVE — AB

## 2021-10-25 LAB — ECHOCARDIOGRAM COMPLETE
AR max vel: 2.79 cm2
AV Peak grad: 7.1 mmHg
Ao pk vel: 1.33 m/s
Area-P 1/2: 3.91 cm2
Calc EF: 62.6 %
Height: 68 in
S' Lateral: 2.7 cm
Single Plane A2C EF: 67.3 %
Single Plane A4C EF: 57.5 %
Weight: 4272 oz

## 2021-10-25 LAB — LIPID PANEL
Cholesterol: 171 mg/dL (ref 0–200)
HDL: 37 mg/dL — ABNORMAL LOW (ref >40)
LDL Cholesterol: 90 mg/dL (ref 0–99)
Total CHOL/HDL Ratio: 4.6 RATIO
Triglycerides: 222 mg/dL — ABNORMAL HIGH (ref <150)
VLDL: 44 mg/dL — ABNORMAL HIGH (ref 0–40)

## 2021-10-25 LAB — HEMOGLOBIN A1C
Hgb A1c MFr Bld: 5.3 % (ref 4.8–5.6)
Mean Plasma Glucose: 105.41 mg/dL

## 2021-10-25 LAB — URINALYSIS, ROUTINE W REFLEX MICROSCOPIC
Bilirubin Urine: NEGATIVE
Glucose, UA: NEGATIVE mg/dL
Ketones, ur: NEGATIVE mg/dL
Leukocytes,Ua: NEGATIVE
Nitrite: NEGATIVE
Protein, ur: NEGATIVE mg/dL
Specific Gravity, Urine: 1.032 — ABNORMAL HIGH (ref 1.005–1.030)
pH: 5 (ref 5.0–8.0)

## 2021-10-25 LAB — URINE DRUG SCREEN, QUALITATIVE (ARMC ONLY)
Amphetamines, Ur Screen: NOT DETECTED
Barbiturates, Ur Screen: NOT DETECTED
Benzodiazepine, Ur Scrn: POSITIVE — AB
Cannabinoid 50 Ng, Ur ~~LOC~~: NOT DETECTED
Cocaine Metabolite,Ur ~~LOC~~: NOT DETECTED
MDMA (Ecstasy)Ur Screen: NOT DETECTED
Methadone Scn, Ur: NOT DETECTED
Opiate, Ur Screen: NOT DETECTED
Phencyclidine (PCP) Ur S: NOT DETECTED
Tricyclic, Ur Screen: NOT DETECTED

## 2021-10-25 LAB — CORTISOL-AM, BLOOD: Cortisol - AM: 5 ug/dL — ABNORMAL LOW (ref 6.7–22.6)

## 2021-10-25 LAB — ETHANOL: Alcohol, Ethyl (B): <10 mg/dL (ref <10)

## 2021-10-25 LAB — TSH: TSH: 2.356 u[IU]/mL (ref 0.350–4.500)

## 2021-10-25 IMAGING — MR MR HEAD W/O CM
12 series · 46 of 48 positions shown · non-contrast
Comparison: Head CT [DATE].

CLINICAL DATA: 40-year-old female right side facial droop and
numbness with headache. Positive [PH] last month.

EXAM:
MRI HEAD WITHOUT CONTRAST
TECHNIQUE: Multiplanar, multiecho pulse sequences of the brain and surrounding
structures were obtained without intravenous contrast.

[Series 5: ax dwi_tracew · axial · 3.0mm · 0.65mm/px · z∈[-90,+58]mm · 3 of 46 slices shown]
[im 1/46]
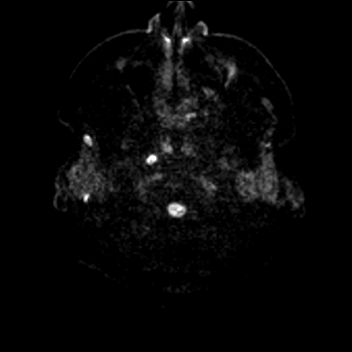
[im 23/46]
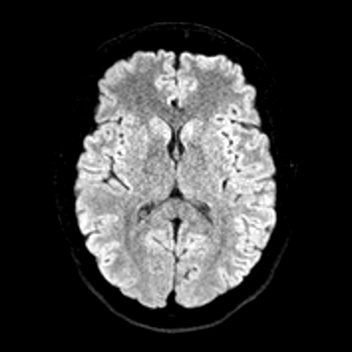
[im 46/46]
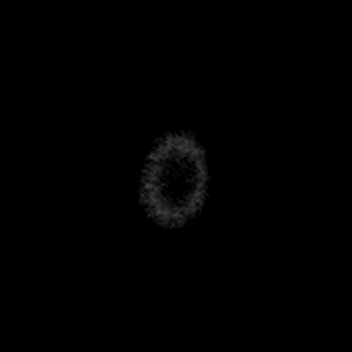

[Series 6: ax dwi_adc · axial · 3.0mm · 0.65mm/px · z∈[-90,+58]mm · 3 of 46 slices shown]
[im 1/46]
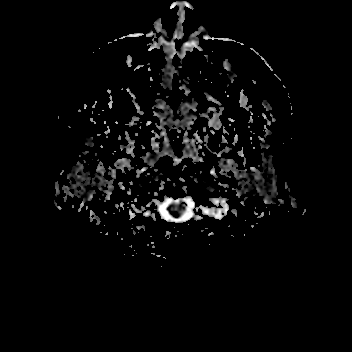
[im 23/46]
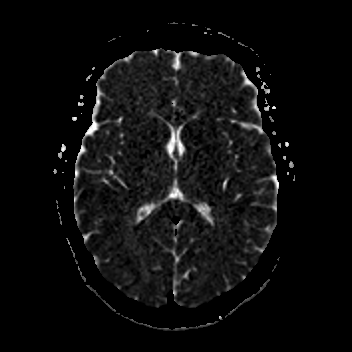
[im 46/46]
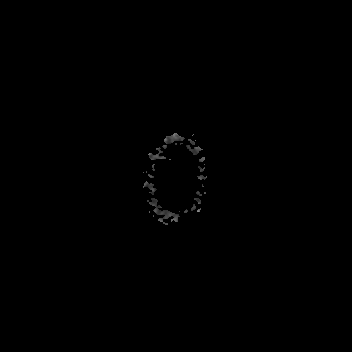

[Series 7: cor dwi_tracew · coronal · 5.0mm · 0.60mm/px · 3 of 36 slices shown]
[im 1/36]
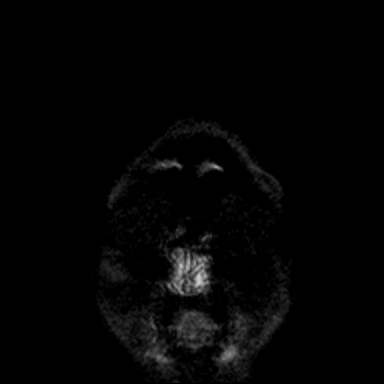
[im 18/36]
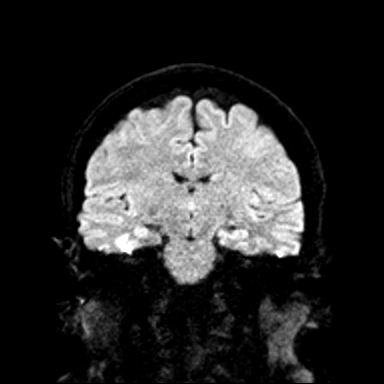
[im 36/36]
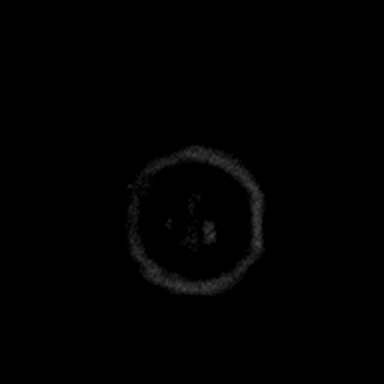

[Series 8: cor dwi_adc · coronal · 5.0mm · 0.60mm/px · 3 of 36 slices shown]
[im 1/36]
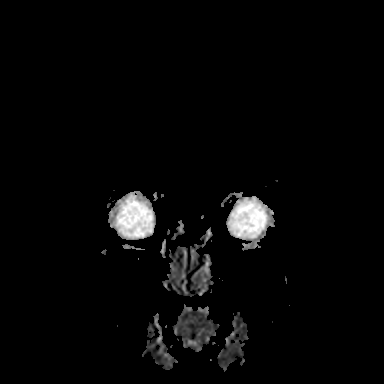
[im 18/36]
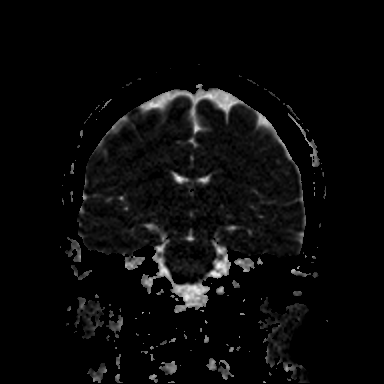
[im 36/36]
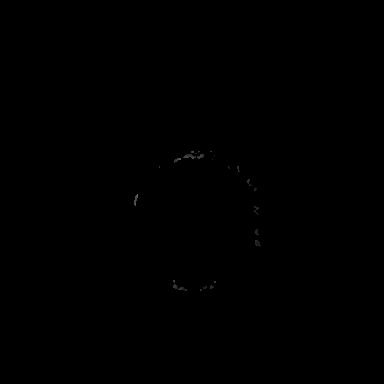

[Series 9: T1 · sagittal · 5.0mm · 0.62mm/px · 2 of 22 slices shown (1 of 2)]
[im 1/22]
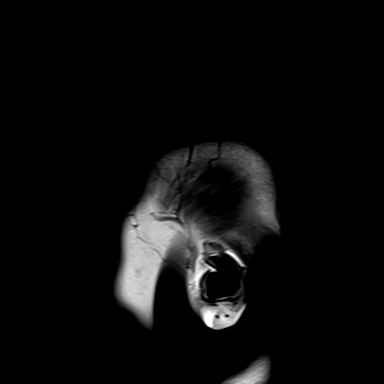
[im 22/22]
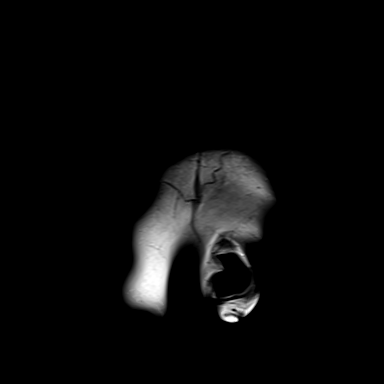

[Series 10: T2 · axial · 5.0mm · 0.53mm/px · z∈[-95,+55]mm · 2 of 26 slices shown (1 of 2)]
[im 1/26]
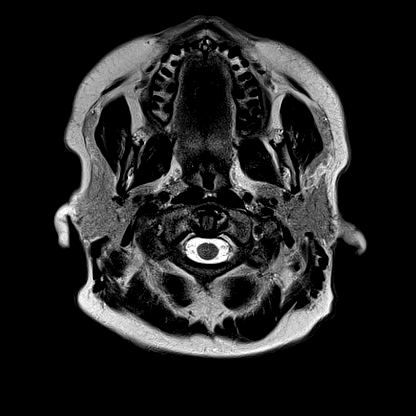
[im 26/26]
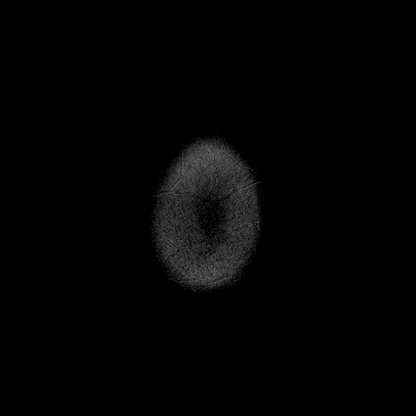

[Series 11: mag_images · axial · 3.0mm · 0.90mm/px · z∈[-109,+68]mm · 5 of 60 slices shown]
[im 1/60]
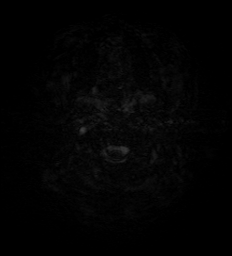
[im 15/60]
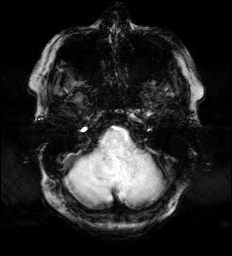
[im 30/60]
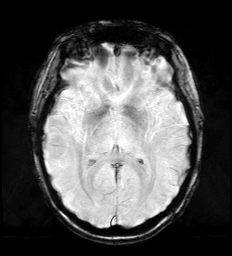
[im 45/60]
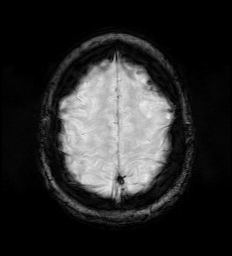
[im 60/60]
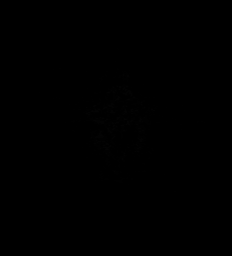

[Series 12: pha_images · axial · 3.0mm · 0.90mm/px · z∈[-106,+68]mm · 4 of 58 slices shown]
[im 1/58]
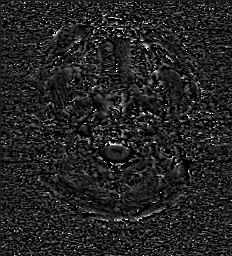
[im 20/58]
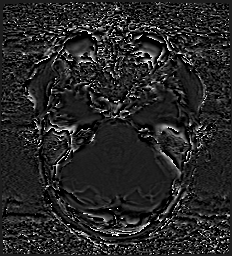
[im 39/58]
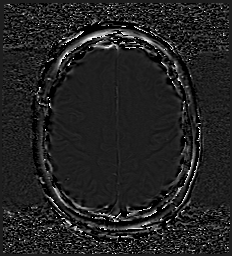
[im 58/58]
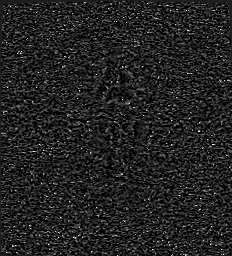

[Series 13: swi_images · axial · 3.0mm · 0.90mm/px · z∈[-109,+68]mm · 5 of 60 slices shown]
[im 1/60]
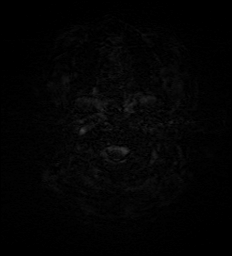
[im 15/60]
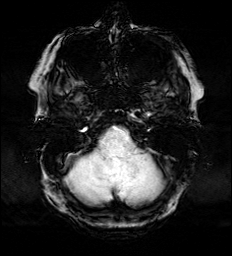
[im 30/60]
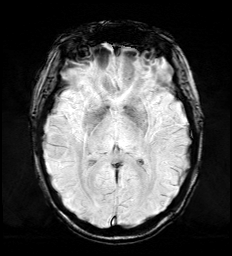
[im 45/60]
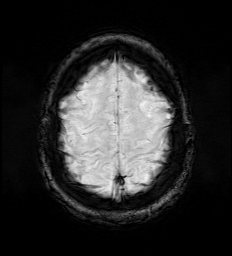
[im 60/60]
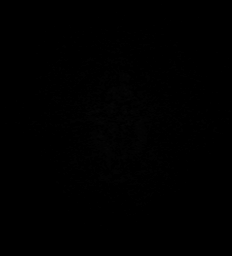

[Series 15: FLAIR · axial · 3.0mm · 0.53mm/px · z∈[-115,+75]mm · 4 of 54 slices shown]
[im 1/54]
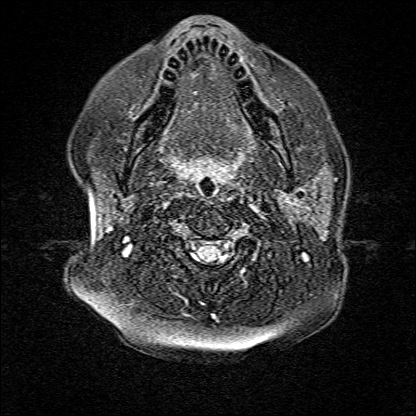
[im 18/54]
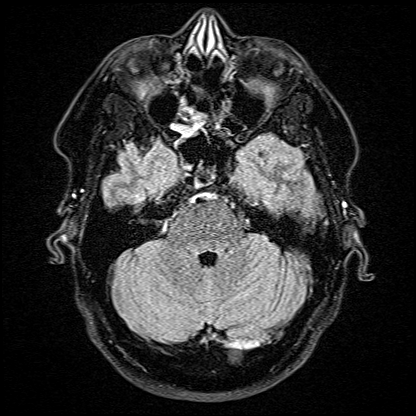
[im 36/54]
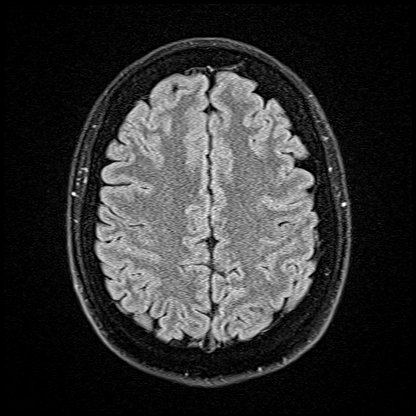
[im 54/54]
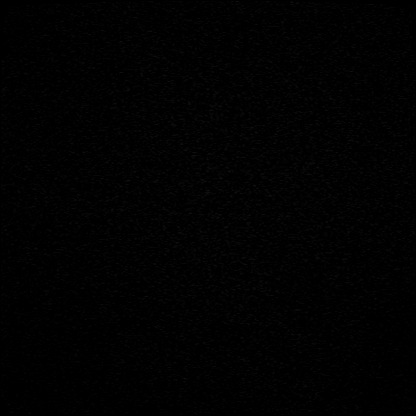

[Series 16: T1 · axial · 1.0mm · 0.98mm/px · z∈[-95,+63]mm · 10 of 159 slices shown (2 of 2)]
[im 1/159]
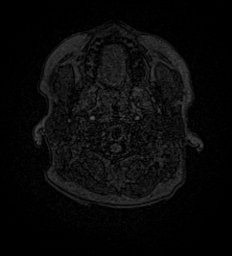
[im 15/159]
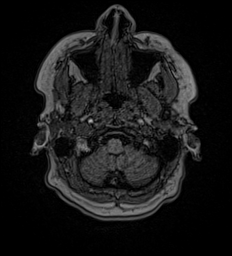
[im 29/159]
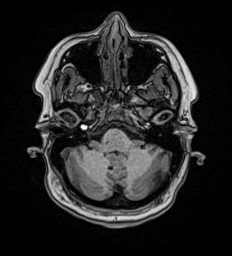
[im 44/159]
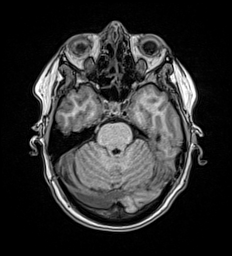
[im 58/159]
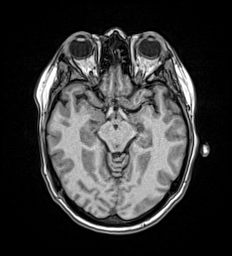
[im 72/159]
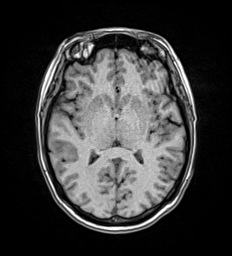
[im 87/159]
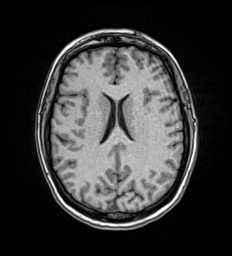
[im 115/159]
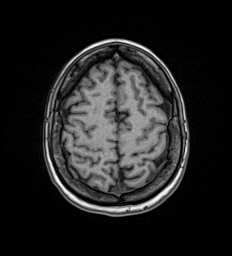
[im 130/159]
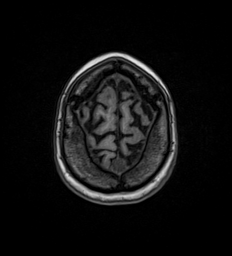
[im 159/159]
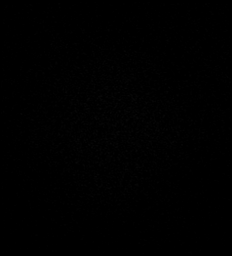

[Series 17: T2 · coronal · 5.0mm · 0.57mm/px · 2 of 28 slices shown (2 of 2)]
[im 1/28]
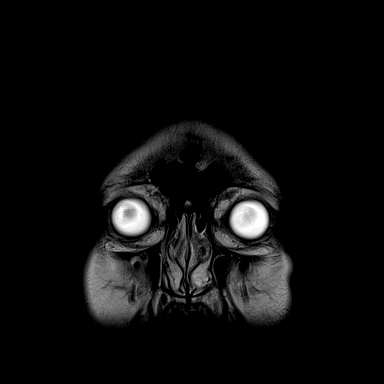
[im 28/28]
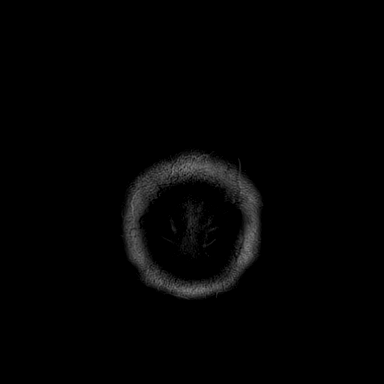

[46 of 48 positions shown; findings below may reference images not displayed]

FINDINGS: Brain: No restricted diffusion to suggest acute infarction. No
midline shift, mass effect, evidence of mass lesion,
ventriculomegaly, extra-axial collection or acute intracranial
hemorrhage. Cervicomedullary junction within normal limits.

Normal cerebral volume. Gray and white matter signal is within
normal limits throughout the brain. No encephalomalacia or chronic
cerebral blood products.

Partially empty sella.

Vascular: Major intracranial vascular flow voids are preserved, the
left vertebral artery appears dominant.

Skull and upper cervical spine: Negative visible cervical spine.
Visualized bone marrow signal is within normal limits.

Sinuses/Orbits: Orbits do demonstrate a conspicuous degree of CSF in
the bilateral optic nerve root sleeves. Questionable subtle
flattening of the posterior globes.

Scattered moderate ethmoid and sphenoid sinus mucosal thickening. No
sinus fluid level.

Other: Mastoids are clear. Grossly normal visible internal auditory
structures. Negative visible scalp and face.
IMPRESSION: 1. Constellation of partially empty sella and conspicuous CSF in the
optic nerve root sleeves can be normal anatomic variation, but also
can be seen with idiopathic intracranial hypertension (pseudotumor
cerebri). Query papilledema.
2. Otherwise normal noncontrast MRI appearance of the brain.
3. Mild to moderate paranasal sinus inflammation.

## 2021-10-25 MED ORDER — COSYNTROPIN 0.25 MG IJ SOLR
0.2500 mg | Freq: Once | INTRAMUSCULAR | Status: AC
  Administered 2021-10-26: 05:00:00 0.25 mg via INTRAVENOUS
  Filled 2021-10-25: qty 0.25

## 2021-10-25 MED ORDER — ATORVASTATIN CALCIUM 20 MG PO TABS
80.0000 mg | ORAL_TABLET | Freq: Every day | ORAL | Status: DC
  Administered 2021-10-25 – 2021-10-26 (×2): 80 mg via ORAL
  Filled 2021-10-25 (×2): qty 4

## 2021-10-25 MED ORDER — IOHEXOL 350 MG/ML SOLN
100.0000 mL | Freq: Once | INTRAVENOUS | Status: AC | PRN
  Administered 2021-10-25: 100 mL via INTRAVENOUS
  Filled 2021-10-25: qty 100

## 2021-10-25 MED ORDER — CLOPIDOGREL BISULFATE 75 MG PO TABS
75.0000 mg | ORAL_TABLET | Freq: Every day | ORAL | Status: DC
  Administered 2021-10-25 – 2021-10-26 (×2): 75 mg via ORAL
  Filled 2021-10-25 (×2): qty 1

## 2021-10-25 MED ORDER — ACETAMINOPHEN 325 MG PO TABS: 650.0000 mg | ORAL_TABLET | Freq: Four times a day (QID) | ORAL | Status: DC | PRN

## 2021-10-25 MED ORDER — ATORVASTATIN CALCIUM 80 MG PO TABS: 80.0000 mg | ORAL_TABLET | Freq: Every day | ORAL | 1 refills | Status: DC

## 2021-10-25 MED ORDER — STROKE: EARLY STAGES OF RECOVERY BOOK: Freq: Once | Status: AC

## 2021-10-25 MED ORDER — LORAZEPAM 2 MG/ML IJ SOLN
1.0000 mg | Freq: Once | INTRAMUSCULAR | Status: AC
  Administered 2021-10-25: 1 mg via INTRAVENOUS
  Filled 2021-10-25: qty 1

## 2021-10-25 MED ORDER — CLOPIDOGREL BISULFATE 75 MG PO TABS
75.0000 mg | ORAL_TABLET | Freq: Every day | ORAL | 0 refills | Status: AC
Start: 2021-10-26 — End: 2021-11-15

## 2021-10-25 MED ORDER — HYDRALAZINE HCL 20 MG/ML IJ SOLN: 10.0000 mg | Freq: Four times a day (QID) | INTRAMUSCULAR | Status: DC | PRN

## 2021-10-25 MED ORDER — SODIUM CHLORIDE 0.9 % IV BOLUS
500.0000 mL | Freq: Once | INTRAVENOUS | Status: AC
  Administered 2021-10-25: 500 mL via INTRAVENOUS

## 2021-10-25 MED ORDER — ASPIRIN EC 81 MG PO TBEC: 81.0000 mg | DELAYED_RELEASE_TABLET | Freq: Every day | ORAL | 1 refills | Status: DC

## 2021-10-25 MED ORDER — ASPIRIN 81 MG PO CHEW
324.0000 mg | CHEWABLE_TABLET | Freq: Every day | ORAL | Status: DC
  Administered 2021-10-25: 324 mg via ORAL
  Filled 2021-10-25: qty 4

## 2021-10-25 MED ORDER — POTASSIUM CHLORIDE CRYS ER 20 MEQ PO TBCR
20.0000 meq | EXTENDED_RELEASE_TABLET | Freq: Once | ORAL | Status: AC
  Administered 2021-10-25: 20 meq via ORAL
  Filled 2021-10-25: qty 1

## 2021-10-25 MED ORDER — DIAZEPAM 5 MG PO TABS
5.0000 mg | ORAL_TABLET | Freq: Once | ORAL | Status: AC
  Administered 2021-10-25: 5 mg via ORAL
  Filled 2021-10-25: qty 1

## 2021-10-25 MED ORDER — ASPIRIN 81 MG PO CHEW
81.0000 mg | CHEWABLE_TABLET | Freq: Every day | ORAL | Status: DC
  Administered 2021-10-26: 09:00:00 81 mg via ORAL
  Filled 2021-10-25: qty 1

## 2021-10-25 MED ORDER — SODIUM CHLORIDE 0.9 % IV SOLN: INTRAVENOUS | Status: DC

## 2021-10-25 NOTE — ED Notes (Addendum)
Pt reported underneath her right eye and also the right side of her lips- top and bottom lip and corner are beginning to tingle and feel numb. Mildly lessened sensation reported on left leg. Dr. Allena Katz notified in person by nurse of update when at bedside with patient.

## 2021-10-25 NOTE — Progress Notes (Signed)
°   10/24/21 2245  Clinical Encounter Type  Visited With Patient  Visit Type Initial;Spiritual support;Social support  Spiritual Encounters  Spiritual Needs Prayer   Chaplain Burris attended to Pt after CT. Chaplain Burris provided a non-anxious, compassionate presence. Offered prayer at Pt's request & visit was much appreciated.

## 2021-10-25 NOTE — ED Notes (Signed)
Patient is resting comfortably. 

## 2021-10-25 NOTE — Consult Note (Signed)
Neurology Consultation Reason for Consult: Transient facial droop Referring Physician: Leslye Peer, R  CC: Right facial droop  History is obtained from: Patient, daughter  HPI: Kayla [name] is a 41 y.o. female with a history of hypertension who presents with right facial droop.  She states that she was working and it started abruptly.  She FaceTime her daughter who said that she did not look right and therefore she presented to the emergency department.  She states that she also felt like that side was heavy, and it might have been some mild numbness/tingling.  This lasted for several hours and has gradually resolved.  She has had some difficulty with headaches since COVID 2 weeks ago.  But is slightly better today.    LKW: 8:30 PM tpa given?: no, mild symptoms   ROS: A 14 point ROS was performed and is negative except as noted in the HPI. Past medical history: Hypertension  Family History  Problem Relation Age of Onset   Diabetes Mother    Hypertension Mother      Social History:  reports that she has been smoking cigarettes. She does not have any smokeless tobacco history on file. She reports current alcohol use. No history on file for drug use.   Exam: Current vital signs: BP (!) 103/91 (BP Location: Left Arm)    Pulse 82    Temp 98.3 F (36.8 C) (Oral)    Resp 15    Ht 5\' 8"  (1.727 m)    Wt 121.1 kg    SpO2 100%    BMI 40.60 kg/m  Vital signs in last 24 hours: Temp:  [98.3 F (36.8 C)] 98.3 F (36.8 C) (01/06 2212) Pulse Rate:  [80-114] 82 (01/07 1200) Resp:  [15-23] 15 (01/07 1200) BP: (103-165)/(59-108) 103/91 (01/07 1200) SpO2:  [95 %-100 %] 100 % (01/07 1200) Weight:  [121.1 kg] 121.1 kg (01/06 2212)   Physical Exam  Constitutional: Appears well-developed and well-nourished.  Psych: Affect appropriate to situation Eyes: No scleral injection, no clear papilledema HENT: No OP obstruction MSK: no joint deformities.  Cardiovascular: Normal rate and regular rhythm.   Respiratory: Effort normal, non-labored breathing GI: Soft.  No distension. There is no tenderness.  Skin: WDI  Neuro: Mental Status: Patient is awake, alert, oriented to person, place, month, year, and situation. Patient is able to give a clear and coherent history. No signs of aphasia or neglect Cranial Nerves: II: Visual Fields are full. Pupils are equal, round, and reactive to light.   III,IV, VI: EOMI without ptosis or diploplia.  V: Facial sensation is symmetric to temperature VII: Facial movement is symmetric.  VIII: hearing is intact to voice X: Uvula elevates symmetrically XI: Shoulder shrug is symmetric. XII: tongue is midline without atrophy or fasciculations.  Motor: Tone is normal. Bulk is normal. 5/5 strength was present in all four extremities.  Sensory: Sensation is symmetric to light touch and temperature in the arms and legs. Cerebellar: FNF intact      I have reviewed labs in epic and the results pertinent to this consultation are: LDL 90  I have reviewed the images obtained: MRI brain-negative, CTA-negative  Impression: 41 year old female with history of hypertension who presents with transient right facial weakness.  My suspicion at this time is for transient ischemic attack.  The findings concerning for IIH I think are unrelated to her current presentation.  She does not have any clear symptoms consistent with IIH, but I do think it would be prudent to get  a formal ophthalmologic evaluation as an outpatient.  Recommendations: 1) aspirin 81 mg and Plavix 75 mg for 3 weeks followed by aspirin monotherapy 2) start atorvastatin for goal LDL less than 70 3) follow-up echo 4) if no embolic source on echocardiogram, no further inpatient recommendations at this time.   Roland Rack, MD [facility] [phone]  If 7pm- 7am, please page neurology on call as listed in [place].

## 2021-10-25 NOTE — ED Notes (Signed)
Pt transported to MRI 

## 2021-10-25 NOTE — ED Notes (Signed)
Notified patient of need for urine and gave patient urine cup and placed hat in toilet at bedside.

## 2021-10-25 NOTE — Evaluation (Signed)
Occupational Therapy Evaluation Patient Details Name: Kayla [name] MRN: [id number] DOB: 01/17/1981 Today's Date: 10/25/2021   History of Present Illness Pt is a 41 y/o F who presents to ED with c/o headache and R side facial droop. She has PMH: HTN adn recent + COVID-19 on 10/12/21. S/s appear resolved at time of therapy evaluation. CT of head was negative for acute intracranial abnormality. MRI results as follows: "Constellation of partially empty sella and conspicuous CSF in the  optic nerve root sleeves can be normal anatomic variation, but also  can be seen with idiopathic intracranial hypertension (pseudotumor  cerebri). Query papilledema." Otherwise normal.   Clinical Impression   Pt seen for OT evaluation this date in setting of acute hospitalization d/t R side facial numbness and droop which appears to be resolved at this time. OT assesses pt facial nerves and muscles. Pt appears to be back to baseline. Her speech is clear and vision at baseline. Pt's strength, coordination, ROM and sensation are functional and equal bilaterally in her face, trunk and limbs. She demos no balance deficits and is able to perform seated or standing upper or lower body ADLs w/o physical assistance. She appears to be at her functional baseline. Safe to return home with PRN family support from OT standpoint. No further OT support needs perceived at this time.      Recommendations for follow up therapy are one component of a multi-disciplinary discharge planning process, led by the attending physician.  Recommendations may be updated based on patient status, additional functional criteria and insurance authorization.   Follow Up Recommendations  No OT follow up    Assistance Recommended at Discharge PRN  Patient can return home with the following      Functional Status Assessment  Patient has not had a recent decline in their functional status  Equipment Recommendations  None recommended by OT     Recommendations for Other Services       Precautions / Restrictions Restrictions Weight Bearing Restrictions: No      Mobility Bed Mobility Overal bed mobility: Independent                  Transfers Overall transfer level: Independent                        Balance Overall balance assessment: Independent                                         ADL either performed or assessed with clinical judgement   ADL Overall ADL's : Independent;At baseline                                             Vision Patient Visual Report: No change from baseline Vision Assessment?: Yes Eye Alignment: Within Functional Limits Ocular Range of Motion: Within Functional Limits Alignment/Gaze Preference: Within Defined Limits Tracking/Visual Pursuits: Able to track stimulus in all quads without difficulty Saccades: Within functional limits Convergence: Within functional limits     Perception     Praxis      Pertinent Vitals/Pain Pain Assessment: No/denies pain     Hand Dominance     Extremity/Trunk Assessment Upper Extremity Assessment Upper Extremity Assessment: Overall WFL for tasks assessed (sensation/coordination/strength/ROM equal and functional  bilaterally)   Lower Extremity Assessment Lower Extremity Assessment: Overall WFL for tasks assessed       Communication Communication Communication: No difficulties   Cognition Arousal/Alertness: Awake/alert Behavior During Therapy: WFL for tasks assessed/performed Overall Cognitive Status: Within Functional Limits for tasks assessed                                       General Comments       Exercises Other Exercises Other Exercises: Ed with pt and dtr re: role of OT, stroke s/s: BEFAST   Shoulder Instructions      Home Living Family/patient expects to be discharged to:: Private residence Living Arrangements: Children Available Help at Discharge:  Family Type of Home: Other(Comment) (duplex) Home Access: Stairs to enter     Home Layout: One level               Home Equipment: None          Prior Functioning/Environment Prior Level of Function : Independent/Modified Independent;Driving;Working/employed                        OT Problem List: Impaired sensation      OT Treatment/Interventions: Self-care/ADL training    OT Goals(Current goals can be found in the care plan section) Acute Rehab OT Goals Patient Stated Goal: to go home OT Goal Formulation: All assessment and education complete, DC therapy  OT Frequency:      Co-evaluation              AM-PAC OT "6 Clicks" Daily Activity     Outcome Measure Help from another person eating meals?: None Help from another person taking care of personal grooming?: None Help from another person toileting, which includes using toliet, bedpan, or urinal?: None Help from another person bathing (including washing, rinsing, drying)?: None Help from another person to put on and taking off regular upper body clothing?: None Help from another person to put on and taking off regular lower body clothing?: None 6 Click Score: 24   End of Session Nurse Communication: Mobility status  Activity Tolerance: Patient tolerated treatment well Patient left: in bed;with call bell/phone within reach;with family/visitor present  OT Visit Diagnosis: Other symptoms and signs involving the nervous system (R29.898)                Time: 0932-3557 OT Time Calculation (min): 12 min Charges:  OT General Charges $OT Visit: 1 Visit OT Evaluation $OT Eval Low Complexity: 1 Low OT Treatments $Self Care/Home Management : 8-22 mins  Rejeana Brock, MS, OTR/L ascom [phone] 10/25/21, 11:33 AM

## 2021-10-25 NOTE — Progress Notes (Addendum)
SLP Cancellation Note  Patient Details Name: Kayla [name] MRN: [id number] DOB: [birth year]   Cancelled treatment:       Reason Eval/Treat Not Completed: SLP screened, no needs identified, will sign off (chart reviewed; consulted pt, NSG) Pt denied any difficulty swallowing and is currently on a regular diet; tolerates swallowing pills w/ water per NSG. Pt conversed in conversation w/out expressive/receptive deficits noted; pt denied any speech-language deficits. Speech clear. Pt reported that the "heaviness" in the R eyelid and face is now resolving; no asymmetry in labial ROM. Pt was able to demonstrate appropriate facial/brow ROM during exaggerated movements.  Noted negative results of MRI for infarct; normal CT Angio Head/Neck.  No further skilled ST services indicated as pt appears at her baseline. Pt agreed. NSG to reconsult if any change in status while admitted.       Jerilynn Som, MS, CCC-SLP Speech Language Pathologist Rehab Services [phone] [facility] 10/25/2021, 10:48 AM

## 2021-10-25 NOTE — ED Notes (Signed)
Pt reports "right eye feels less heavy overall". Patient still having deficits in sensation on right side of face but not on any extremities.

## 2021-10-25 NOTE — Evaluation (Signed)
Physical Therapy Evaluation Patient Details Name: Kayla [name] MRN: [id number] DOB: [birth year] Today's Date: 10/25/2021  History of Present Illness  Pt is a 41 y/o F who presents to ED with c/o headache and R side facial droop. She has PMH: HTN adn recent + COVID-19 on 10/12/21. S/s appear resolved at time of therapy evaluation. CT of head was negative for acute intracranial abnormality. MRI results as follows: "Constellation of partially empty sella and conspicuous CSF in the  optic nerve root sleeves can be normal anatomic variation, but also  can be seen with idiopathic intracranial hypertension (pseudotumor  cerebri). Query papilledema." Otherwise normal.   Clinical Impression  Patient sleeping upon arrival. Awoke easily but reports being very sleepy. Last BP reading 87/61 mmHg and RN approved PT attempt. Patient appeared drowsy but cognition appeared [facility]. She lives at home with her daughter with one step to enter. 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 Upon PT eval, patient reported continuous sleepy feeling and she completed bed mobility, transfers, and ambulation with supervision due to low blood pressure and symptoms. Malfunction of monitor battery did not allow it to work when disconnected to the wall and RN approved walking to the bathroom without the monitor. BP did improve to 109/51 mmHg after patient stat up. Patient proceeded to ambulate to bathroom across hall and use it with supervision and stated she felt "off" and very heavy upon return, prompting RN to request she return to bed immediately where she planned to take BP in a few min. Patient's only functional limitations at this point appear to be related to low blood pressure. She will be followed by PT due to her funcitonal limitations this session, but is expected to have quick return to PLOF with resolution of low blood pressure and in that case PT recommends discharge  home with no services. Will continue to follow and update as needed. Patient would benefit from skilled physical therapy to address impairments and functional limitations (see PT Problem List below) to work towards stated goals and return to PLOF or maximal functional independence.        Recommendations for follow up therapy are one component of a multi-disciplinary discharge planning process, led by the attending physician.  Recommendations may be updated based on patient status, additional functional criteria and insurance authorization.  Follow Up Recommendations No PT follow up    Assistance Recommended at Discharge None (as long as low BP resolves)  Patient can return home with the following  Other (comment) (resolution of medical symptoms and low BP)    Equipment Recommendations None recommended by PT  Recommendations for Other Services       Functional Status Assessment Patient has had a recent decline in their functional status and demonstrates the ability to make significant improvements in function in a reasonable and predictable amount of time.     Precautions / Restrictions Precautions Precautions: Fall Restrictions Weight Bearing Restrictions: No      Mobility  Bed Mobility Overal bed mobility: Independent                  Transfers Overall transfer level: Needs assistance   Transfers: Sit to/from Stand Sit to Stand: Supervision           General transfer comment: patient completed sit <> stand while being monitored closely due to drowsiness and low blood pressure reading. She completed toileting with supervision in case of syncope due  to low BP.    Ambulation/Gait Ambulation/Gait assistance: Supervision Gait Distance (Feet): 80 Feet Assistive device: None Gait Pattern/deviations: WFL(Within Functional Limits) Gait velocity: decreased     General Gait Details: Patient ambulated to bathroom across the hallway and back with supervision and no  AD. She had slightly altered gait pattern she attributed to her shoes being on awkwardly. Supervision provided due to drowsiness and low blood pressure.  Stairs            Wheelchair Mobility    Modified Rankin (Stroke Patients Only)       Balance Overall balance assessment: Independent;Needs assistance Sitting-balance support: No upper extremity supported Sitting balance-Leahy Scale: Normal     Standing balance support: No upper extremity supported Standing balance-Leahy Scale: Good Standing balance comment: patient states she feels heavy and a little "off" prompting RN to request she return to bed immediately.                             Pertinent Vitals/Pain Pain Assessment: No/denies pain    Home Living Family/patient expects to be discharged to:: Private residence Living Arrangements: Children Available Help at Discharge: Family Type of Home: Other(Comment) (duplex) Home Access: Stairs to enter   Entrance Stairs-Number of Steps: 1 step, no handrail   Home Layout: One level Home Equipment: None      Prior Function Prior Level of Function : Independent/Modified Independent;Driving;Working/employed                     Hand Dominance        Extremity/Trunk Assessment   Upper Extremity Assessment Upper Extremity Assessment: Overall WFL for tasks assessed    Lower Extremity Assessment Lower Extremity Assessment: Overall WFL for tasks assessed    Cervical / Trunk Assessment Cervical / Trunk Assessment: Normal  Communication   Communication: No difficulties  Cognition Arousal/Alertness:  (drowsy) Behavior During Therapy: WFL for tasks assessed/performed Overall Cognitive Status: Within Functional Limits for tasks assessed                                 General Comments: patient reports feeling very sleepy and appears drowsy, but she responds appropriately to conversation and cognition seems intact.         General Comments General comments (skin integrity, edema, etc.): Patient's most recent BP 87/61 mmHg just prior to session. BP improved after sitting up and dangling feet for a few minutes then returning to long sitting: 109/51 mmHg.    Exercises Other Exercises Other Exercises: educated pt on role of PT in acute care setting.   Assessment/Plan    PT Assessment Patient needs continued PT services  PT Problem List Decreased activity tolerance;Decreased balance;Decreased mobility;Cardiopulmonary status limiting activity       PT Treatment Interventions DME instruction;Gait training;Stair training;Functional mobility training;Therapeutic activities;Therapeutic exercise;Patient/family education;Neuromuscular re-education;Balance training    PT Goals (Current goals can be found in the Care Plan section)  Acute Rehab PT Goals Patient Stated Goal: to go home PT Goal Formulation: With patient Time For Goal Achievement: 11/08/21 Potential to Achieve Goals: Good    Frequency 7X/week     Co-evaluation               AM-PAC PT "6 Clicks" Mobility  Outcome Measure Help needed turning from your back to your side while in a flat bed without using bedrails?: None Help  needed moving from lying on your back to sitting on the side of a flat bed without using bedrails?: None Help needed moving to and from a bed to a chair (including a wheelchair)?: A Little Help needed standing up from a chair using your arms (e.g., wheelchair or bedside chair)?: A Little Help needed to walk in hospital room?: A Little Help needed climbing 3-5 steps with a railing? : A Little 6 Click Score: 20    End of Session Equipment Utilized During Treatment: Gait belt Activity Tolerance: Treatment limited secondary to medical complications (Comment) (limited by low blood pressure and symptoms of heaviness and feeling "off") Patient left: in bed;with nursing/sitter in room Nurse Communication: Mobility status PT  Visit Diagnosis: Difficulty in walking, not elsewhere classified (R26.2);Unsteadiness on feet (R26.81);Other symptoms and signs involving the nervous system (R29.898)    Time: 1341-1400 PT Time Calculation (min) (ACUTE ONLY): 19 min   Charges:   PT Evaluation $PT Eval Low Complexity: 1 Low          Kayla [name] R. Ilsa Iha, PT, DPT 10/25/21, 2:23 PM

## 2021-10-25 NOTE — ED Notes (Signed)
Messaged attending regarding need for further antianxiety medication in addition to previously given Diazepam for patient to complete MR due to claustrophobia.

## 2021-10-25 NOTE — Progress Notes (Signed)
Patient ID: Kayla [name], female   DOB: 05-14-1981, 41 y.o.   MRN: [id number] [facility] PROGRESS NOTE  Kayla [name] [id number] DOB: April 17, 1981 DOA: 10/24/2021 PCP: Dian Situ, MD  HPI/Subjective: Patient seen this morning and her symptoms of right facial droop and difficulty opening her right eye have resolved.  She also complains of right facial numbness upon coming in.  MRI of the brain was negative.  CT angio of the head and neck was negative.  This afternoon I was called with low blood pressure with blood pressure dropping down into the 80s and she felt weak.  I ordered for a fluid bolus to be given and continuous fluids.  Objective: Vitals:   10/25/21 1400 10/25/21 1630  BP: 98/75 95/69  Pulse: 84 75  Resp: (!) 24 18  Temp:    SpO2: 100% 98%   No intake or output data in the 24 hours ending 10/25/21 1640 Filed Weights   10/24/21 2212  Weight: 121.1 kg    ROS: Review of Systems  Respiratory:  Negative for shortness of breath.   Cardiovascular:  Negative for chest pain.  Gastrointestinal:  Negative for abdominal pain, nausea and vomiting.  Exam: Physical Exam HENT:     Head: Normocephalic.     Mouth/Throat:     Pharynx: No oropharyngeal exudate.  Eyes:     General: Lids are normal.     Conjunctiva/sclera: Conjunctivae normal.  Cardiovascular:     Rate and Rhythm: Normal rate and regular rhythm.     Heart sounds: Normal heart sounds, S1 normal and S2 normal.  Pulmonary:     Breath sounds: No decreased breath sounds, wheezing, rhonchi or rales.  Abdominal:     Palpations: Abdomen is soft.     Tenderness: There is no abdominal tenderness.  Musculoskeletal:     Right lower leg: No swelling.     Left lower leg: No swelling.  Skin:    General: Skin is warm.     Findings: No rash.  Neurological:     Mental Status: She is alert and oriented to person, place, and time.     Comments: Cranial nerves II through XII grossly intact.      Scheduled Meds:    stroke: mapping our early stages of recovery book   Does not apply Once   [START ON 10/26/2021] aspirin  81 mg Oral Daily   atorvastatin  80 mg Oral Daily   clopidogrel  75 mg Oral Daily   [START ON 10/26/2021] cosyntropin  0.25 mg Intravenous Once   Continuous Infusions:  sodium chloride 100 mL/hr at 10/25/21 1610    Assessment/Plan:  Hypotension this afternoon.  IV fluids bolus and maintenance fluids ordered. TIA.  Patient came in with symptoms of right facial paralysis and paresthesias which has resolved.  MRI negative.  CT angio head and neck negative.  Echocardiogram no source of stroke.  Neurology recommended aspirin, Plavix for 21 days and Lipitor.  LDL 90. Partial empty sella.  A.m. cortisol borderline low.  We will do a cosyntropin test tomorrow morning.  Send off ACTH tomorrow morning.     Code Status: Full code Family Communication: Spoke with daughter at the bedside Disposition Plan: Status is: [place]  [facility]

## 2021-10-25 NOTE — Progress Notes (Signed)
*  PRELIMINARY RESULTS* Echocardiogram 2D Echocardiogram has been performed.  Kayla [name] 10/25/2021, 8:49 AM

## 2021-10-25 NOTE — Progress Notes (Addendum)
MD notified of patient BP. Order for bolus placed. Pt reports feeling "very sleepy and heavy".

## 2021-10-25 NOTE — Consult Note (Signed)
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 Her neurological examination reveals R facial weakness in an UMN distribution as well as sensory loss on the R side of the body involving the face. This is suspicious for central etiology, likely ischemia. WOuld recommend admission for full stroke work-up to evaluate.  Metrics: Last Known Well: 10/24/2021 13:59:32 [facility] Notification Time: 10/24/2021 22:06:20 Arrival Time: 10/24/2021 21:35:23 Stamp Time: 10/24/2021 22:06:20 Initial Response Time: 10/24/2021 22:31:50 Symptoms: R sensorimotor loss in the face. NIHSS Start Assessment Time: 10/24/2021 22:36:07 Patient is not a candidate for Thrombolytic. Thrombolytic Medical Decision: 10/24/2021 22:42:30 Patient was not deemed candidate for Thrombolytic because of following reasons: Last Well Known Above 4.5 Hours.  CT head showed no acute hemorrhage or acute core infarct.  ED Physician notified of diagnostic impression and management plan on 10/25/2021 00:13:14  Advanced Imaging: Advanced Imaging Not Completed because:  clinical picture not consistent with LVO   Our recommendations are outlined below.  Recommendations:        Stroke/Telemetry Floor       Neuro Checks       Bedside Swallow Eval       DVT Prophylaxis       IV Fluids, Normal Saline       Head of Bed 30 Degrees       Euglycemia and Avoid Hyperthermia (PRN Acetaminophen)       Initiate or continue Aspirin 325 MG daily       Antihypertensives PRN if Blood pressure is greater than 220/120 or there is a concern for End organ damage/contraindications for permissive HTN. If blood  pressure is greater than 220/120 give labetalol PO or IV or Vasotec IV with a goal of 15% reduction in BP during the first 24 hours.  Routine Consultation with [facility] for Follow up Care  Sign Out:       Discussed with Emergency Department Provider    ------------------------------------------------------------------------------  History of Present Illness: Patient is a 41 year old Female.  Patient was brought by private transportation with symptoms of R sensorimotor loss in the face. Pt reports she's had a headache most of the evening since 2-3pm. She didn't think much of it, she was working and noticed that her R eye felt heavy. She then noticed that something wasn't right so she checked in the restroom and thought there was something there. But then when she saw her daughter, she noted that there was a difference in her face so then she noticed heaviness in the R side of the face too. Denies any associated bulbar deficits. She didn't note any limb involvement but does have sensory asymmetry with decrease on the R side throughout.    Past Medical History:      Hypertension      Covid-19      There is no history of Diabetes Mellitus      There is no history of Hyperlipidemia      There is no history of Atrial Fibrillation      There is no history of Coronary Artery Disease      There is no history of Stroke  There is no history of Seizures      There is no history of Migraine Headaches  Medications:  No Anticoagulant use  No Antiplatelet use Reviewed EMR for current medications  Allergies:  Reviewed Description: sulfa and levaquin  Social History: Smoking: Yes Alcohol Use: No Drug Use: No  Family History:  There is no family history of premature cerebrovascular disease pertinent to this consultation  ROS : 14 Points Review of Systems was performed and was negative except mentioned in HPI.  Past Surgical History: There Is No Surgical History  Contributory To Todays Visit     Examination: BP(136/86), Pulse(85), Blood Glucose(107) 1A: Level of Consciousness - Alert; keenly responsive + 0 1B: Ask Month and Age - Both Questions Right + 0 1C: Blink Eyes & Squeeze Hands - Performs Both Tasks + 0 2: Test Horizontal Extraocular Movements - Normal + 0 3: Test Visual Fields - No Visual Loss + 0 4: Test Facial Palsy (Use Grimace if Obtunded) - Minor paralysis (flat nasolabial fold, smile asymmetry) + 1 5A: Test Left Arm Motor Drift - No Drift for 10 Seconds + 0 5B: Test Right Arm Motor Drift - No Drift for 10 Seconds + 0 6A: Test Left Leg Motor Drift - No Drift for 5 Seconds + 0 6B: Test Right Leg Motor Drift - No Drift for 5 Seconds + 0 7: Test Limb Ataxia (FNF/Heel-Shin) - No Ataxia + 0 8: Test Sensation - Mild-Moderate Loss: Less Sharp/More Dull + 1 9: Test Language/Aphasia - Normal; No aphasia + 0 10: Test Dysarthria - Normal + 0 11: Test Extinction/Inattention - No abnormality + 0  NIHSS Score: 2   Pre-Morbid Modified Rankin Scale: 0 Points = No symptoms at all   Patient/Family was informed the Neurology Consult would occur via TeleHealth consult by way of interactive audio and video telecommunications and consented to receiving care in this manner.   Patient is being evaluated for possible acute neurologic impairment and high probability of imminent or life-threatening deterioration. I spent total of 35 minutes providing care to this patient, including time for face to face visit via telemedicine, review of medical records, imaging studies and discussion of findings with providers, the patient and/or family.   Dr Elzie Rings   [facility] [phone]   Case [id number]

## 2021-10-26 DIAGNOSIS — E66813 Obesity, class 3: Secondary | ICD-10-CM

## 2021-10-26 LAB — CBC
HCT: 33.5 % — ABNORMAL LOW (ref 36.0–46.0)
Hemoglobin: 11.4 g/dL — ABNORMAL LOW (ref 12.0–15.0)
MCH: 31 pg (ref 26.0–34.0)
MCHC: 34 g/dL (ref 30.0–36.0)
MCV: 91 fL (ref 80.0–100.0)
Platelets: 240 10*3/uL (ref 150–400)
RBC: 3.68 MIL/uL — ABNORMAL LOW (ref 3.87–5.11)
RDW: 12.7 % (ref 11.5–15.5)
WBC: 6 10*3/uL (ref 4.0–10.5)
nRBC: 0 % (ref 0.0–0.2)

## 2021-10-26 LAB — BASIC METABOLIC PANEL
Anion gap: 6 (ref 5–15)
BUN: 11 mg/dL (ref 6–20)
CO2: 21 mmol/L — ABNORMAL LOW (ref 22–32)
Calcium: 8.2 mg/dL — ABNORMAL LOW (ref 8.9–10.3)
Chloride: 108 mmol/L (ref 98–111)
Creatinine, Ser: 0.56 mg/dL (ref 0.44–1.00)
GFR, Estimated: >60 mL/min (ref >60)
Glucose, Bld: 100 mg/dL — ABNORMAL HIGH (ref 70–99)
Potassium: 4 mmol/L (ref 3.5–5.1)
Sodium: 135 mmol/L (ref 135–145)

## 2021-10-26 LAB — GLUCOSE, CAPILLARY: Glucose-Capillary: 79 mg/dL (ref 70–99)

## 2021-10-26 LAB — ACTH STIMULATION, 3 TIME POINTS
Cortisol, 30 Min: 19.1 ug/dL
Cortisol, 60 Min: 22.8 ug/dL
Cortisol, Base: 6.1 ug/dL

## 2021-10-26 LAB — MAGNESIUM: Magnesium: 2 mg/dL (ref 1.7–2.4)

## 2021-10-26 LAB — RPR: RPR Ser Ql: NONREACTIVE

## 2021-10-26 MED ORDER — ENOXAPARIN SODIUM 80 MG/0.8ML IJ SOSY
0.5000 mg/kg | PREFILLED_SYRINGE | INTRAMUSCULAR | Status: DC
  Filled 2021-10-26: qty 0.63

## 2021-10-26 NOTE — Discharge Summary (Signed)
[facility] - [place] [facility]   PATIENT NAME: Kayla [name]    MR#:  [id number]  DATE OF BIRTH:  03/25/1981  DATE OF ADMISSION:  10/24/2021 ADMITTING PHYSICIAN: Gertha Calkin, MD  DATE OF DISCHARGE: 10/26/2021 11:33 AM  PRIMARY CARE PHYSICIAN: None   ADMISSION DIAGNOSIS:  Facial weakness [R29.810] Stroke-like symptoms [R29.90] Facial paresthesia [R20.2]  DISCHARGE DIAGNOSIS:  Principal Problem:   Facial paresthesia Active Problems:   Essential hypertension   Tobacco abuse   SECONDARY DIAGNOSIS:  History reviewed. No pertinent past medical history.  HOSPITAL COURSE:   TIA.  Patient came in with right facial paralysis and paresthesias which has resolved.  MRI of the brain negative for stroke.  CT angio of the head and neck were negative for large vessel occlusion.  Echocardiogram showed no source of stroke.  Neurology recommended aspirin lifelong, Plavix for 21 days total only then discontinue and Lipitor.  Patient's LDL is 90.  Recommend following up with new PMD and checking liver function test in about 6 weeks.  We will also refer to [facility] neurology as outpatient. Hypotension yesterday afternoon with blood pressure dropping into the 80s.  Patient was given IV fluid bolus and maintenance fluid bolus.  Patient not orthostatic upon discharge home.  Blood pressure normal range. 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  A.m. cortisol was borderline low.  A cosyntropin test showed appropriate response.  ACTH still pending. Obesity with a BMI of 41.09 Recent COVID infection and finished Paxlovid outpatient.  This was not an active issue on this hospitalization.  DISCHARGE CONDITIONS:   Satisfactory  CONSULTS OBTAINED:  Neurology  DRUG ALLERGIES:   Allergies  Allergen Reactions   Levaquin [Levofloxacin] Other (See Comments)    paranoid   Sulfa Antibiotics Rash    DISCHARGE MEDICATIONS:   Allergies as of 10/26/2021       Reactions   Levaquin  [levofloxacin] Other (See Comments)   paranoid   Sulfa Antibiotics Rash        Medication List     STOP taking these medications    hydrochlorothiazide 25 MG tablet Commonly known as: HYDRODIURIL   ibuprofen 200 MG tablet Commonly known as: ADVIL   lisinopril 20 MG tablet Commonly known as: ZESTRIL   meclizine 25 MG tablet Commonly known as: ANTIVERT   oxyCODONE-acetaminophen 5-325 MG tablet Commonly known as: PERCOCET/ROXICET   PAXLOVID (300/100) PO       TAKE these medications    acetaminophen 500 MG tablet Commonly known as: TYLENOL Take 1,000 mg by mouth every 8 (eight) hours as needed for mild pain.   albuterol 108 (90 Base) MCG/ACT inhaler Commonly known as: VENTOLIN HFA Inhale 2 puffs into the lungs every 6 (six) hours as needed for wheezing or shortness of breath.   aspirin EC 81 MG tablet Take 1 tablet (81 mg total) by mouth daily. Swallow whole.   atorvastatin 80 MG tablet Commonly known as: LIPITOR Take 1 tablet (80 mg total) by mouth daily.   clopidogrel 75 MG tablet Commonly known as: PLAVIX Take 1 tablet (75 mg total) by mouth daily for 20 days.         DISCHARGE INSTRUCTIONS:   Referral to PMD Referral to [facility] neurology  If you experience worsening of your admission symptoms, develop shortness of breath, life threatening emergency, suicidal or homicidal thoughts you must seek medical attention immediately by calling 911 or calling your MD immediately  if symptoms less severe.  You Must read  complete instructions/literature along with all the possible adverse reactions/side effects for all the Medicines you take and that have been prescribed to you. Take any new Medicines after you have completely understood and accept all the possible adverse reactions/side effects.   Please note  You were cared for by a hospitalist during your hospital stay. If you have any questions about your discharge medications or the care you received  while you were in the hospital after you are discharged, you can call the unit and asked to speak with the hospitalist on call if the hospitalist that took care of you is not available. Once you are discharged, your primary care physician will handle any further medical issues. Please note that NO REFILLS for any discharge medications will be authorized once you are discharged, as it is imperative that you return to your primary care physician (or establish a relationship with a primary care physician if you do not have one) for your aftercare needs so that they can reassess your need for medications and monitor your lab values.    Today   CHIEF COMPLAINT:   Chief Complaint  Patient presents with   Code Stroke    HISTORY OF PRESENT ILLNESS:  Kayla [name]  is a 41 y.o. female came in with right facial paralysis and numbness.   VITAL SIGNS:  Blood pressure (!) 143/95, pulse 89, temperature 97.9 F (36.6 C), resp. rate 20, height 5\' 9"  (1.753 m), weight 126.2 kg, SpO2 100 %.  I/O:   Intake/Output Summary (Last 24 hours) at 10/26/2021 1608 Last data filed at 10/26/2021 0502 Gross per 24 hour  Intake 1274.41 ml  Output --  Net 1274.41 ml    PHYSICAL EXAMINATION:  GENERAL:  41 y.o.-year-old patient lying in the bed with no acute distress.  EYES: Pupils equal, round, reactive to light and accommodation. No scleral icterus. Extraocular muscles intact.  HEENT: Head atraumatic, normocephalic. Oropharynx and nasopharynx clear.  NECK:  Supple, no jugular venous distention. No thyroid enlargement, no tenderness.  LUNGS: Normal breath sounds bilaterally, no wheezing, rales,rhonchi or crepitation. No use of accessory muscles of respiration.  CARDIOVASCULAR: S1, S2 normal. No murmurs, rubs, or gallops.  ABDOMEN: Soft, non-tender, non-distended. Bowel sounds present. No organomegaly or mass.  EXTREMITIES: No pedal edema, cyanosis, or clubbing.  NEUROLOGIC: Cranial nerves II through XII are intact.  Muscle strength 5/5 in all extremities. Sensation intact. Gait not checked.  PSYCHIATRIC: The patient is alert and oriented x 3.  SKIN: No obvious rash, lesion, or ulcer.   DATA REVIEW:   CBC Recent Labs  Lab 10/26/21 0457  WBC 6.0  HGB 11.4*  HCT 33.5*  PLT 240    Chemistries  Recent Labs  Lab 10/24/21 2153 10/26/21 0457  NA 135 135  K 3.4* 4.0  CL 108 108  CO2 19* 21*  GLUCOSE 108* 100*  BUN 15 11  CREATININE 0.81 0.56  CALCIUM 8.7* 8.2*  MG  --  2.0  AST 22  --   ALT 26  --   ALKPHOS 58  --   BILITOT 0.5  --      Microbiology Results  Results for orders placed or performed during the hospital encounter of 10/24/21  Resp Panel by RT-PCR (Flu A&B, Covid) Nasopharyngeal Swab     Status: Abnormal   Collection Time: 10/24/21 10:41 PM   Specimen: Nasopharyngeal Swab; Nasopharyngeal(NP) swabs in vial transport medium  Result Value Ref Range Status   SARS Coronavirus 2 by RT PCR POSITIVE (  A) NEGATIVE Final    Comment: (NOTE) SARS-CoV-2 target nucleic acids are DETECTED.  The SARS-CoV-2 RNA is generally detectable in upper respiratory specimens during the acute phase of infection. Positive results are indicative of the presence of the identified virus, but do not rule out bacterial infection or co-infection with other pathogens not detected by the test. Clinical correlation with patient history and other diagnostic information is necessary to determine patient infection status. The expected result is Negative.  Fact Sheet for Patients: BloggerCourse.comhttps://www.fda.gov/media/152166/download  Fact Sheet for Healthcare Providers: SeriousBroker.ithttps://www.fda.gov/media/152162/download  This test is not yet approved or cleared by the [place] FDA and  has been authorized for detection and/or diagnosis of SARS-CoV-2 by FDA under an Emergency Use Authorization (EUA).  This EUA will remain in effect (meaning this test can be used) for the duration of  the COVID-19 declaration under  Section 564(b)(1) of the A ct, 21 U.S.C. section 360bbb-3(b)(1), unless the authorization is terminated or revoked sooner.     Influenza A by PCR NEGATIVE NEGATIVE Final   Influenza B by PCR NEGATIVE NEGATIVE Final    Comment: (NOTE) The Xpert Xpress SARS-CoV-2/FLU/RSV plus assay is intended as an aid in the diagnosis of influenza from Nasopharyngeal swab specimens and should not be used as a sole basis for treatment. Nasal washings and aspirates are unacceptable for Xpert Xpress SARS-CoV-2/FLU/RSV testing.  Fact Sheet for Patients: BloggerCourse.comhttps://www.fda.gov/media/152166/download  Fact Sheet for Healthcare Providers: SeriousBroker.ithttps://www.fda.gov/media/152162/download  This test is not yet approved or cleared by the [place] FDA and has been authorized for detection and/or diagnosis of SARS-CoV-2 by FDA under an Emergency Use Authorization (EUA). This EUA will remain in effect (meaning this test can be used) for the duration of the COVID-19 declaration under Section 564(b)(1) of the Act, 21 U.S.C. section 360bbb-3(b)(1), unless the authorization is terminated or revoked.  Performed at [facility], [address] [place], [place] [address]     RADIOLOGY:  MR BRAIN WO CONTRAST  Result Date: 10/25/2021 CLINICAL DATA:  41 year old female right side facial droop and numbness with headache. Positive COVID-19 last month. EXAM: MRI HEAD WITHOUT CONTRAST TECHNIQUE: Multiplanar, multiecho pulse sequences of the brain and surrounding structures were obtained without intravenous contrast. COMPARISON:  Head CT 10/24/2021. FINDINGS: Brain: No restricted diffusion to suggest acute infarction. No midline shift, mass effect, evidence of mass lesion, ventriculomegaly, extra-axial collection or acute intracranial hemorrhage. Cervicomedullary junction within normal limits. Normal cerebral volume. Wallace CullensGray and white matter signal is within normal limits throughout the brain. No encephalomalacia or chronic  cerebral blood products. Partially empty sella. Vascular: Major intracranial vascular flow voids are preserved, the left vertebral artery appears dominant. Skull and upper cervical spine: Negative visible cervical spine. Visualized bone marrow signal is within normal limits. Sinuses/Orbits: Orbits do demonstrate a conspicuous degree of CSF in the bilateral optic nerve root sleeves. Questionable subtle flattening of the posterior globes. Scattered moderate ethmoid and sphenoid sinus mucosal thickening. No sinus fluid level. Other: Mastoids are clear. Grossly normal visible internal auditory structures. Negative visible scalp and face. IMPRESSION: 1. Constellation of partially empty sella and conspicuous CSF in the optic nerve root sleeves can be normal anatomic variation, but also can be seen with idiopathic intracranial hypertension (pseudotumor cerebri). Query papilledema. 2. Otherwise normal noncontrast MRI appearance of the brain. 3. Mild to moderate paranasal sinus inflammation. Electronically Signed   By: Odessa FlemingH  Hall M.D.   On: 10/25/2021 04:53   ECHOCARDIOGRAM COMPLETE  Result Date: 10/25/2021    ECHOCARDIOGRAM REPORT  Patient Name:   JAELEAH SMYSER Date of Exam: 10/25/2021 Medical Rec #:  [id number]  Height:       68.0 in Accession #:    [id number] Weight:       267.0 lb Date of Birth:  Oct 12, 1981 BSA:          2.311 m Patient Age:    40 years   BP:           111/59 mmHg Patient Gender: F          HR:           68 bpm. Exam Location:  [facility] Procedure: 2D Echo and Strain Analysis Indications:     Stroke I63.9  History:         Patient has no prior history of Echocardiogram examinations.  Sonographer:     Overton Mam RDCS Referring Phys:  [id number] Eliezer Mccoy PATEL Diagnosing Phys: Julien Nordmann MD  Sonographer Comments: Global longitudinal strain was attempted. IMPRESSIONS  1. Left ventricular ejection fraction, by estimation, is 60 to 65%. The left ventricle has normal function. The left ventricle has no regional  wall motion abnormalities. Left ventricular diastolic parameters were normal. The average left ventricular global longitudinal strain is -23.0 %. The global longitudinal strain is normal.  2. Right ventricular systolic function is normal. The right ventricular size is normal. Tricuspid regurgitation signal is inadequate for assessing PA pressure.  3. The mitral valve is normal in structure. Mild mitral valve regurgitation. No evidence of mitral stenosis.  4. The aortic valve is normal in structure. Aortic valve regurgitation is not visualized. No aortic stenosis is present.  5. The inferior vena cava is normal in size with greater than 50% respiratory variability, suggesting right atrial pressure of 3 mmHg. FINDINGS  Left Ventricle: Left ventricular ejection fraction, by estimation, is 60 to 65%. The left ventricle has normal function. The left ventricle has no regional wall motion abnormalities. The average left ventricular global longitudinal strain is -23.0 %. The global longitudinal strain is normal. The left ventricular internal cavity size was normal in size. There is no left ventricular hypertrophy. Left ventricular diastolic parameters were normal. Right Ventricle: The right ventricular size is normal. No increase in right ventricular wall thickness. Right ventricular systolic function is normal. Tricuspid regurgitation signal is inadequate for assessing PA pressure. Left Atrium: Left atrial size was normal in size. Right Atrium: Right atrial size was normal in size. Pericardium: There is no evidence of pericardial effusion. Mitral Valve: The mitral valve is normal in structure. Mild mitral valve regurgitation. No evidence of mitral valve stenosis. Tricuspid Valve: The tricuspid valve is normal in structure. Tricuspid valve regurgitation is mild . No evidence of tricuspid stenosis. Aortic Valve: The aortic valve is normal in structure. Aortic valve regurgitation is not visualized. No aortic stenosis is  present. Aortic valve peak gradient measures 7.1 mmHg. Pulmonic Valve: The pulmonic valve was normal in structure. Pulmonic valve regurgitation is not visualized. No evidence of pulmonic stenosis. Aorta: The aortic root is normal in size and structure. Venous: The inferior vena cava is normal in size with greater than 50% respiratory variability, suggesting right atrial pressure of 3 mmHg. IAS/Shunts: No atrial level shunt detected by color flow Doppler.  LEFT VENTRICLE PLAX 2D LVIDd:         3.90 cm      Diastology LVIDs:         2.70 cm      LV e' medial:    11.70  cm/s LV PW:         1.20 cm      LV E/e' medial:  8.7 LV IVS:        1.00 cm      LV e' lateral:   12.50 cm/s LVOT diam:     2.10 cm      LV E/e' lateral: 8.2 LV SV:         76 LV SV Index:   33           2D Longitudinal Strain LVOT Area:     3.46 cm     2D Strain GLS Avg:     -23.0 %  LV Volumes (MOD) LV vol d, MOD A2C: 132.0 ml LV vol d, MOD A4C: 82.5 ml LV vol s, MOD A2C: 43.1 ml LV vol s, MOD A4C: 35.1 ml LV SV MOD A2C:     88.9 ml LV SV MOD A4C:     82.5 ml LV SV MOD BP:      66.5 ml RIGHT VENTRICLE RV Basal diam:  2.90 cm RV S prime:     11.90 cm/s TAPSE (M-mode): 2.4 cm LEFT ATRIUM             Index        RIGHT ATRIUM           Index LA diam:        3.80 cm 1.64 cm/m   RA Area:     16.00 cm LA Vol (A2C):   36.8 ml 15.93 ml/m  RA Volume:   41.60 ml  18.00 ml/m LA Vol (A4C):   27.8 ml 12.03 ml/m LA Biplane Vol: 32.1 ml 13.89 ml/m  AORTIC VALVE                 PULMONIC VALVE AV Area (Vmax): 2.79 cm     PV Vmax:       0.81 m/s AV Vmax:        133.00 cm/s  PV Peak grad:  2.7 mmHg AV Peak Grad:   7.1 mmHg LVOT Vmax:      107.00 cm/s LVOT Vmean:     69.700 cm/s LVOT VTI:       0.219 m  AORTA Ao Root diam: 2.70 cm Ao Asc diam:  2.60 cm MITRAL VALVE                TRICUSPID VALVE MV Area (PHT): 3.91 cm     TV Peak grad:   18.5 mmHg MV Decel Time: 194 msec     TV Vmax:        2.15 m/s MV E velocity: 102.00 cm/s MV A velocity: 54.40 cm/s   SHUNTS  MV E/A ratio:  1.88         Systemic VTI:  0.22 m                             Systemic Diam: 2.10 cm Julien Nordmann MD Electronically signed by Julien Nordmann MD Signature Date/Time: 10/25/2021/2:57:06 PM    Final    CT HEAD CODE STROKE WO CONTRAST  Result Date: 10/24/2021 CLINICAL DATA:  Code stroke.  Facial droop EXAM: CT HEAD WITHOUT CONTRAST TECHNIQUE: Contiguous axial images were obtained from the base of the skull through the vertex without intravenous contrast. COMPARISON:  None. FINDINGS: Brain: There is no mass, hemorrhage or extra-axial collection. The size and configuration of the ventricles and extra-axial CSF  spaces are normal. The brain parenchyma is normal, without evidence of acute or chronic infarction. Vascular: No abnormal hyperdensity of the major intracranial arteries or dural venous sinuses. No intracranial atherosclerosis. Skull: The visualized skull base, calvarium and extracranial soft tissues are normal. Sinuses/Orbits: No fluid levels or advanced mucosal thickening of the visualized paranasal sinuses. No mastoid or middle ear effusion. The orbits are normal. ASPECTS [facility]([place] Stroke Program Early CT Score) - Ganglionic level infarction (caudate, lentiform nuclei, internal capsule, insula, M1-M3 cortex): 7 - Supraganglionic infarction (M4-M6 cortex): 3 Total score (0-10 with 10 being normal): 10 IMPRESSION: 1. Normal head CT. 2. ASPECTS is 10. These results were called by telephone at the time of interpretation on 10/24/2021 at 9:51 pm to provider PHILLIP STAFFORD , who verbally acknowledged these results. Electronically Signed   By: Deatra RobinsonKevin  Herman M.D.   On: 10/24/2021 21:51   CT ANGIO HEAD NECK W WO CM W PERF (CODE STROKE)  Result Date: 10/25/2021 CLINICAL DATA:  41 year old female code stroke presentation. EXAM: CT ANGIOGRAPHY HEAD AND NECK CT PERFUSION BRAIN TECHNIQUE: Multidetector CT imaging of the head and neck was performed using the standard protocol during bolus administration of  intravenous contrast. Multiplanar CT image reconstructions and MIPs were obtained to evaluate the vascular anatomy. Carotid stenosis measurements (when applicable) are obtained utilizing NASCET criteria, using the distal internal carotid diameter as the denominator. Multiphase CT imaging of the brain was performed following IV bolus contrast injection. Subsequent parametric perfusion maps were calculated using RAPID software. CONTRAST:  100mL OMNIPAQUE IOHEXOL 350 MG/ML SOLN COMPARISON:  Plain head CT 10/24/2021. FINDINGS: CT Brain Perfusion Findings: ASPECTS: 10 CBF (<30%) Volume: None Perfusion (Tmax>6.0s) volume: None Mismatch Volume: Not applicable Infarction Location:Not applicable CTA NECK Skeleton: Negative. Upper chest: Negative. Other neck: Negative; the glottis is closed. Aortic arch: 3 vessel arch configuration with no arch atherosclerosis. Right carotid system: Negative. Left carotid system: Negative. Vertebral arteries: Normal proximal right subclavian artery. Non dominant right vertebral artery appears patent and normal to the skull base. Proximal left subclavian artery and dominant left vertebral artery appear normal. CTA HEAD Posterior circulation: Dominant left vertebral primarily supplies the basilar, right V4 segment is diminutive beyond the patent right PICA origin. Normal left PICA origin. Patent basilar artery without stenosis. Normal SCA and left PCA origins. Fetal type right PCA origin. Bilateral PCA branches are within normal limits. Anterior circulation: Both ICA siphons are patent and appear normal. Normal right posterior communicating artery origin. Patent carotid termini. Normal MCA and ACA origins. Normal anterior communicating artery. Bilateral ACA branches are within normal limits. Left MCA M1 segment and bifurcation are patent without stenosis. Right MCA M1 segment and bifurcation are patent without stenosis. Bilateral MCA branches are within normal limits. Venous sinuses: Patent.  Anatomic variants: Dominant left vertebral artery which primarily supplies the basilar and fetal type right PCA origin. Review of the MIP images confirms the above findings IMPRESSION: 1. Normal CTA head and neck. 2. Negative CT Perfusion. Electronically Signed   By: Odessa FlemingH  Hall M.D.   On: 10/25/2021 08:59      Management plans discussed with the patient, family and they are in agreement.  CODE STATUS:     Code Status Orders  (From admission, onward)           Start     Ordered   10/25/21 1652  Full code  Continuous        10/25/21 1651           Code  Status History     This patient has a current code status but no historical code status.       TOTAL TIME TAKING CARE OF THIS PATIENT: 32 minutes.    Alford Highland M.D on 10/26/2021 at 4:08 PM    [facility]  CC: Primary care physician; None

## 2021-10-26 NOTE — TOC Transition Note (Signed)
Transition of Care [facility]) - CM/SW Discharge Note   Patient Details  Name: Kayla [name] MRN: [id number] Date of Birth: 08/15/1981  [facility] CM/SW Contact:  Bing Quarry, RN Phone Number: 10/26/2021, 10:08 AM   Clinical Narrative:  1/8: Admitted 1/6 with facial/eye drooping which has resolved. CT negative. No therapy follow up recommendations. C+ 12/25. Fluid bolus received 1/7 for low blood pressure. No TOC needs for discharge assessed at this time. Gabriel Cirri RN CM      Final next level of care: Home/Self Care Barriers to Discharge: Barriers Resolved   Patient Goals and CMS Choice        Discharge Placement                       Discharge Plan and Services                DME Arranged: N/A DME Agency: NA       HH Arranged: NA HH Agency: NA        Social Determinants of Health (SDOH) Interventions     Readmission Risk Interventions No flowsheet data found.

## 2021-10-27 LAB — ACTH: C206 ACTH: 1.9 pg/mL — ABNORMAL LOW (ref 7.2–63.3)

## 2021-10-29 ENCOUNTER — Telehealth: Payer: Self-pay

## 2021-10-29 NOTE — Telephone Encounter (Signed)
Returned patients vm, she has a referral to [place] from [facility]. Had to leave her a voicemail, asked for a c/b to schedule.

## 2021-11-05 ENCOUNTER — Telehealth: Payer: BLUE CROSS/BLUE SHIELD | Admitting: Nurse Practitioner

## 2021-11-05 DIAGNOSIS — R200 Anesthesia of skin: Secondary | ICD-10-CM | POA: Diagnosis not present

## 2021-11-05 NOTE — Progress Notes (Signed)
Virtual Visit Consent   Kayla [name], you are scheduled for a virtual visit with a [facility] provider today.     Just as with appointments in the office, your consent must be obtained to participate.  Your consent will be active for this visit and any virtual visit you may have with one of our providers in the next 365 days.     If you have a MyChart account, a copy of this consent can be sent to you electronically.  All virtual visits are billed to your insurance company just like a traditional visit in the office.    As this is a virtual visit, video technology does not allow for your provider to perform a traditional examination.  This may limit your provider's ability to fully assess your condition.  If your provider identifies any concerns that need to be evaluated in person or the need to arrange testing (such as labs, EKG, etc.), we will make arrangements to do so.     Although advances in technology are sophisticated, we cannot ensure that it will always work on either your end or our end.  If the connection with a video visit is poor, the visit may have to be switched to a telephone visit.  With either a video or telephone visit, we are not always able to ensure that we have a secure connection.     I need to obtain your verbal consent now.   Are you willing to proceed with your visit today?    Kayla [name] has provided verbal consent on 11/05/2021 for a virtual visit (video or telephone).   [name], FNP   Date: 11/05/2021 5:43 PM   Virtual Visit via Video Note   I, [name], connected with  Kayla [name]  ([id number], 1981-03-01) on 11/05/21 at  6:00 PM EST by a video-enabled telemedicine application and verified that I am speaking with the correct person using two identifiers.  Location: Patient: Virtual Visit Location Patient: Home Provider: Virtual Visit Location Provider: Home Office   I discussed the limitations of evaluation and management by telemedicine and the  availability of in person appointments. The patient expressed understanding and agreed to proceed.    History of Present Illness: Kayla [name] is a 41 y.o. who identifies as a female who was assigned female at birth, and is being seen today with complaints of numbness in the left side of her face.   She was seen in the ED 6 days ago with TIA symptoms. She was admitted and was hospitalized from 10/24/21-10/26/21. She is scheduled to see a Neurologist on 09/17/22 for follow up.   When in the hospital she was started on Plavix, Aspirin and Lipitor.   Impression from MRI done 10/25/21   IMPRESSION: 1. Constellation of partially empty sella and conspicuous CSF in the optic nerve root sleeves can be normal anatomic variation, but also can be seen with idiopathic intracranial hypertension (pseudotumor cerebri). Query papilledema. 2. Otherwise normal noncontrast MRI appearance of the brain. 3. Mild to moderate paranasal sinus inflammation.   Problems:  Patient Active Problem List   Diagnosis Date Noted   Obesity, Class III, BMI 40-49.9 (morbid obesity) (HCC)    Facial paresthesia 10/25/2021   Essential hypertension 10/25/2021   Tobacco abuse 10/25/2021   Hypotension    TIA (transient ischemic attack)    Facial weakness    Empty sella (HCC)     Allergies:  Allergies  Allergen Reactions   Levaquin [  Levofloxacin] Other (See Comments)    paranoid   Sulfa Antibiotics Rash   Medications:  Current Outpatient Medications:    acetaminophen (TYLENOL) 500 MG tablet, Take 1,000 mg by mouth every 8 (eight) hours as needed for mild pain., Disp: , Rfl:    albuterol (VENTOLIN HFA) 108 (90 Base) MCG/ACT inhaler, Inhale 2 puffs into the lungs every 6 (six) hours as needed for wheezing or shortness of breath., Disp: , Rfl:    aspirin EC 81 MG tablet, Take 1 tablet (81 mg total) by mouth daily. Swallow whole., Disp: 30 tablet, Rfl: 1   atorvastatin (LIPITOR) 80 MG tablet, Take 1 tablet (80 mg total) by  mouth daily., Disp: 30 tablet, Rfl: 1   clopidogrel (PLAVIX) 75 MG tablet, Take 1 tablet (75 mg total) by mouth daily for 20 days., Disp: 20 tablet, Rfl: 0  Observations/Objective: Patient is well-developed, well-nourished in no acute distress.  Resting comfortably at home.  Head is normocephalic, atraumatic.  No labored breathing.  Speech is clear and coherent with logical content.  Patient is alert and oriented at baseline.  Face symmetrical  EOMI    Assessment and Plan: 1. Left facial numbness Advised patient she will need to call 911 and be transported back to the hospital. She should not drive. Patient states she has transportation and someone will take her to the hospital now. She is home with family.   Explained repeat imaging is required to assure this is not a stroke. Patient understands and agrees to plan.      Follow Up Instructions: I discussed the assessment and treatment plan with the patient. The patient was provided an opportunity to ask questions and all were answered. The patient agreed with the plan and demonstrated an understanding of the instructions.  A copy of instructions were sent to the patient via MyChart unless otherwise noted below.    The patient was advised to call back or seek an in-person evaluation if the symptoms worsen or if the condition fails to improve as anticipated.  Time:  I spent 10 minutes with the patient via telehealth technology discussing the above problems/concerns.    [name], FNP

## 2021-11-17 ENCOUNTER — Ambulatory Visit: Payer: BLUE CROSS/BLUE SHIELD | Admitting: Diagnostic Neuroimaging

## 2021-11-24 ENCOUNTER — Other Ambulatory Visit: Payer: Self-pay

## 2021-11-24 ENCOUNTER — Observation Stay: Payer: BLUE CROSS/BLUE SHIELD

## 2021-11-24 ENCOUNTER — Observation Stay
Admission: EM | Admit: 2021-11-24 | Discharge: 2021-11-25 | Disposition: A | Discharge status: Home / Self Care | Payer: BLUE CROSS/BLUE SHIELD | Attending: Hospitalist | Admitting: Hospitalist

## 2021-11-24 ENCOUNTER — Emergency Department: Payer: BLUE CROSS/BLUE SHIELD

## 2021-11-24 DIAGNOSIS — Z7982 Long term (current) use of aspirin: Secondary | ICD-10-CM | POA: Insufficient documentation

## 2021-11-24 DIAGNOSIS — I340 Nonrheumatic mitral (valve) insufficiency: Secondary | ICD-10-CM | POA: Insufficient documentation

## 2021-11-24 DIAGNOSIS — R4701 Aphasia: Secondary | ICD-10-CM | POA: Diagnosis present

## 2021-11-24 DIAGNOSIS — Z720 Tobacco use: Secondary | ICD-10-CM | POA: Diagnosis present

## 2021-11-24 DIAGNOSIS — G459 Transient cerebral ischemic attack, unspecified: Principal | ICD-10-CM | POA: Insufficient documentation

## 2021-11-24 DIAGNOSIS — F17210 Nicotine dependence, cigarettes, uncomplicated: Secondary | ICD-10-CM | POA: Insufficient documentation

## 2021-11-24 DIAGNOSIS — Z79899 Other long term (current) drug therapy: Secondary | ICD-10-CM | POA: Insufficient documentation

## 2021-11-24 DIAGNOSIS — I82409 Acute embolism and thrombosis of unspecified deep veins of unspecified lower extremity: Secondary | ICD-10-CM

## 2021-11-24 DIAGNOSIS — I10 Essential (primary) hypertension: Secondary | ICD-10-CM | POA: Diagnosis not present

## 2021-11-24 DIAGNOSIS — Z20822 Contact with and (suspected) exposure to covid-19: Secondary | ICD-10-CM | POA: Diagnosis not present

## 2021-11-24 DIAGNOSIS — R2689 Other abnormalities of gait and mobility: Secondary | ICD-10-CM | POA: Insufficient documentation

## 2021-11-24 DIAGNOSIS — Z8616 Personal history of COVID-19: Secondary | ICD-10-CM | POA: Insufficient documentation

## 2021-11-24 DIAGNOSIS — Z6841 Body Mass Index (BMI) 40.0 and over, adult: Secondary | ICD-10-CM | POA: Insufficient documentation

## 2021-11-24 DIAGNOSIS — I639 Cerebral infarction, unspecified: Secondary | ICD-10-CM

## 2021-11-24 DIAGNOSIS — Z8673 Personal history of transient ischemic attack (TIA), and cerebral infarction without residual deficits: Secondary | ICD-10-CM

## 2021-11-24 LAB — COMPREHENSIVE METABOLIC PANEL
ALT: 30 U/L (ref 0–44)
AST: 34 U/L (ref 15–41)
Albumin: 4 g/dL (ref 3.5–5.0)
Alkaline Phosphatase: 64 U/L (ref 38–126)
Anion gap: 7 (ref 5–15)
BUN: 18 mg/dL (ref 6–20)
CO2: 22 mmol/L (ref 22–32)
Calcium: 9.2 mg/dL (ref 8.9–10.3)
Chloride: 109 mmol/L (ref 98–111)
Creatinine, Ser: 0.8 mg/dL (ref 0.44–1.00)
GFR, Estimated: >60 mL/min (ref >60)
Glucose, Bld: 116 mg/dL — ABNORMAL HIGH (ref 70–99)
Potassium: 3.6 mmol/L (ref 3.5–5.1)
Sodium: 138 mmol/L (ref 135–145)
Total Bilirubin: 0.5 mg/dL (ref 0.3–1.2)
Total Protein: 7.4 g/dL (ref 6.5–8.1)

## 2021-11-24 LAB — DIFFERENTIAL
Abs Immature Granulocytes: 0.03 10*3/uL (ref 0.00–0.07)
Basophils Absolute: 0.1 10*3/uL (ref 0.0–0.1)
Basophils Relative: 1 %
Eosinophils Absolute: 0.2 10*3/uL (ref 0.0–0.5)
Eosinophils Relative: 2 %
Immature Granulocytes: 0 %
Lymphocytes Relative: 35 %
Lymphs Abs: 3.2 10*3/uL (ref 0.7–4.0)
Monocytes Absolute: 0.7 10*3/uL (ref 0.1–1.0)
Monocytes Relative: 7 %
Neutro Abs: 5 10*3/uL (ref 1.7–7.7)
Neutrophils Relative %: 55 %

## 2021-11-24 LAB — CBC
HCT: 37.9 % (ref 36.0–46.0)
HCT: 36.7 % (ref 36.0–46.0)
Hemoglobin: 12.4 g/dL (ref 12.0–15.0)
Hemoglobin: 12.1 g/dL (ref 12.0–15.0)
MCH: 30.2 pg (ref 26.0–34.0)
MCH: 30.5 pg (ref 26.0–34.0)
MCHC: 32.7 g/dL (ref 30.0–36.0)
MCHC: 33 g/dL (ref 30.0–36.0)
MCV: 92.2 fL (ref 80.0–100.0)
MCV: 92.4 fL (ref 80.0–100.0)
Platelets: 303 10*3/uL (ref 150–400)
Platelets: 325 10*3/uL (ref 150–400)
RBC: 4.11 MIL/uL (ref 3.87–5.11)
RBC: 3.97 MIL/uL (ref 3.87–5.11)
RDW: 12.9 % (ref 11.5–15.5)
RDW: 13 % (ref 11.5–15.5)
WBC: 9.1 10*3/uL (ref 4.0–10.5)
WBC: 8.6 10*3/uL (ref 4.0–10.5)
nRBC: 0 % (ref 0.0–0.2)
nRBC: 0 % (ref 0.0–0.2)

## 2021-11-24 LAB — HIV ANTIBODY (ROUTINE TESTING W REFLEX): HIV Screen 4th Generation wRfx: NONREACTIVE

## 2021-11-24 LAB — APTT: aPTT: 32 seconds (ref 24–36)

## 2021-11-24 LAB — RESP PANEL BY RT-PCR (FLU A&B, COVID) ARPGX2
Influenza A by PCR: NEGATIVE
Influenza B by PCR: NEGATIVE
SARS Coronavirus 2 by RT PCR: NEGATIVE

## 2021-11-24 LAB — PROTIME-INR
INR: 1.1 (ref 0.8–1.2)
Prothrombin Time: 14.5 seconds (ref 11.4–15.2)

## 2021-11-24 LAB — CBG MONITORING, ED: Glucose-Capillary: 76 mg/dL (ref 70–99)

## 2021-11-24 IMAGING — MR MR HEAD WO/W CM
14 series · 48 of 48 positions shown · IV contrast (gadavist)
Comparison: Plain head CT this morning.  Brain MRI, CTA CTP 1 723.

CLINICAL DATA: 40-year-old female code stroke presentation. Right
facial droop and aphasia.

EXAM:
MRI HEAD WITHOUT AND WITH CONTRAST
TECHNIQUE: Multiplanar, multiecho pulse sequences of the brain and surrounding
structures were obtained without and with intravenous contrast.
CONTRAST:  10mL GADAVIST GADOBUTROL 1 MMOL/ML IV SOLN

[Series 5: ax dwi_tracew · axial · 3.0mm · 0.65mm/px · z∈[-93,+54]mm · 4 of 92 slices shown]
[im 1/92]
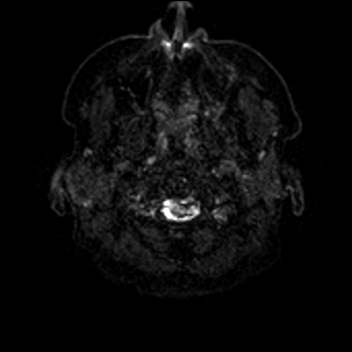
[im 31/92]
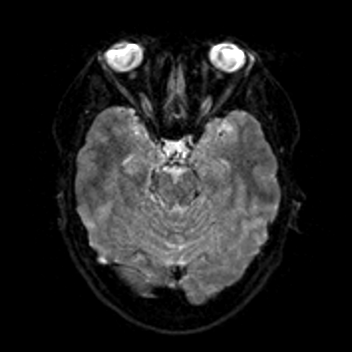
[im 61/92]
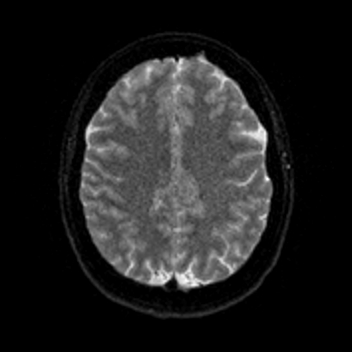
[im 92/92]
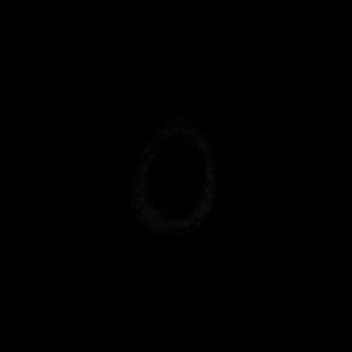

[Series 6: ax dwi_adc · axial · 3.0mm · 0.65mm/px · z∈[-93,+54]mm · 3 of 46 slices shown]
[im 1/46]
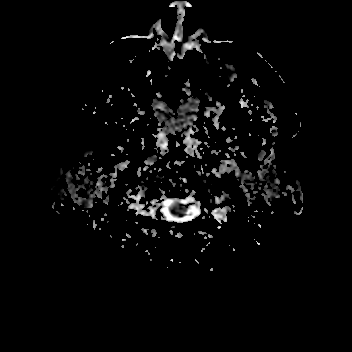
[im 23/46]
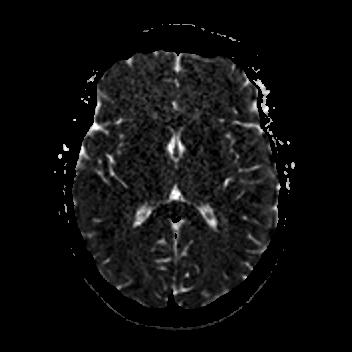
[im 46/46]
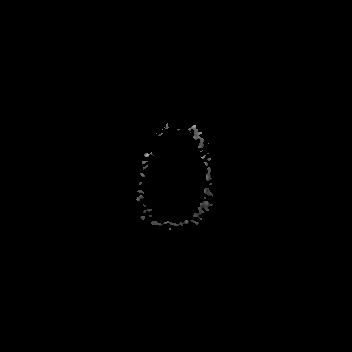

[Series 7: cor dwi_tracew · coronal · 5.0mm · 0.60mm/px · 4 of 72 slices shown]
[im 1/72]
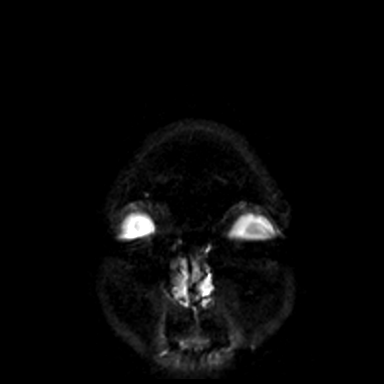
[im 24/72]
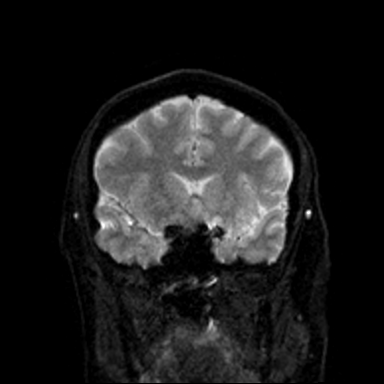
[im 48/72]
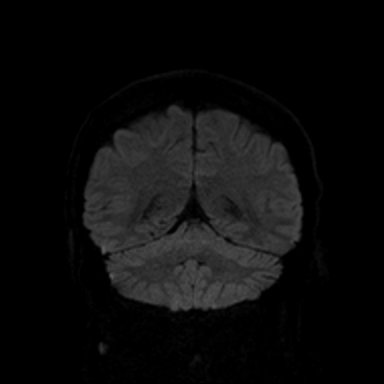
[im 72/72]
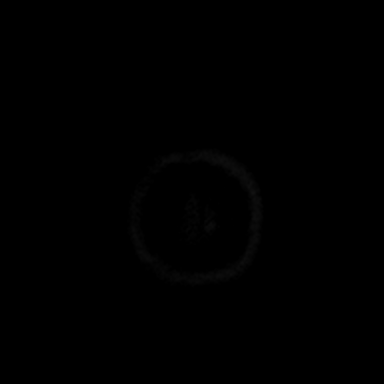

[Series 8: cor dwi_adc · coronal · 5.0mm · 0.60mm/px · 2 of 36 slices shown]
[im 1/36]
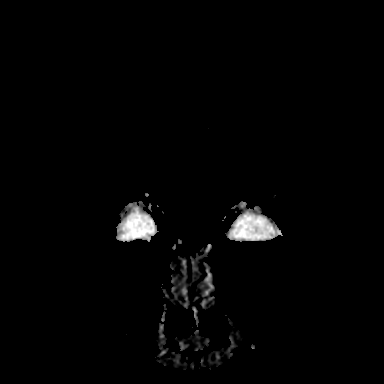
[im 36/36]
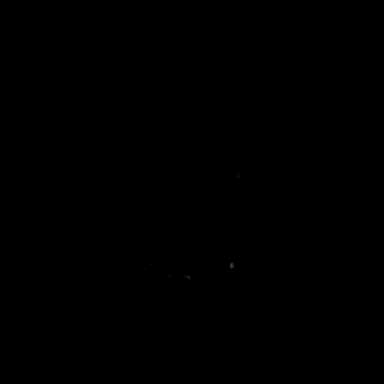

[Series 9: T1 · sagittal · 5.0mm · 0.62mm/px · 1 of 22 slices shown (1 of 2)]
[im 1/22]
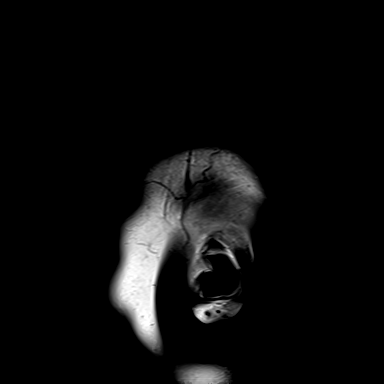

[Series 10: T2 · axial · 5.0mm · 0.53mm/px · 1 of 26 slices shown]
[im 1/26]
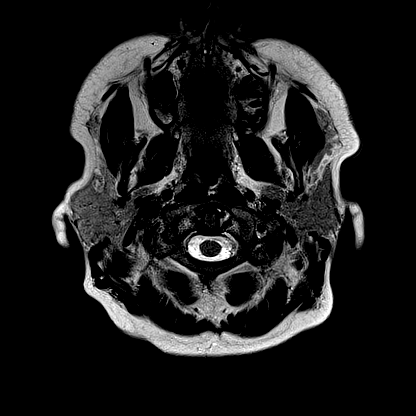

[Series 11: mag_images · axial · 3.0mm · 0.90mm/px · z∈[-102,+61]mm · 3 of 56 slices shown]
[im 1/56]
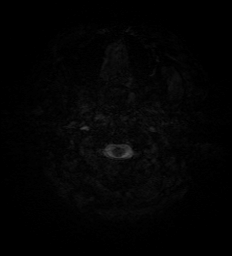
[im 28/56]
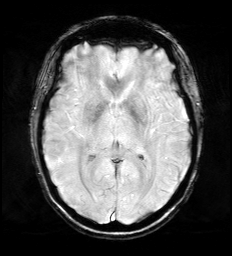
[im 56/56]
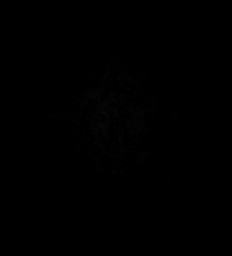

[Series 12: pha_images · axial · 3.0mm · 0.90mm/px · z∈[-102,+55]mm · 3 of 54 slices shown]
[im 1/54]
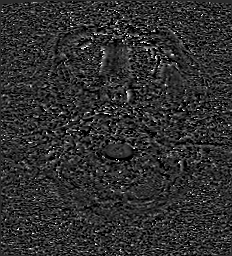
[im 27/54]
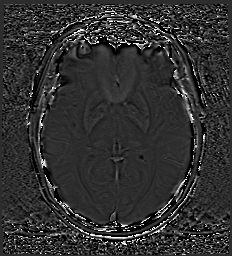
[im 54/54]
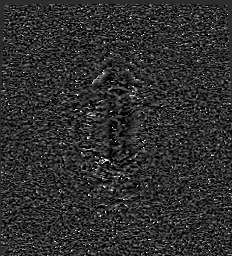

[Series 13: swi_images · axial · 3.0mm · 0.90mm/px · z∈[-102,+61]mm · 3 of 56 slices shown]
[im 1/56]
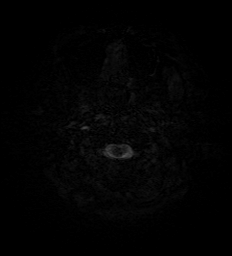
[im 28/56]
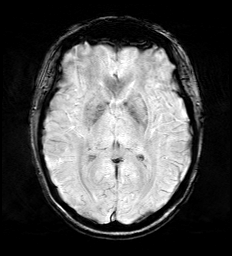
[im 56/56]
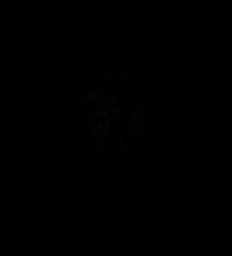

[Series 15: FLAIR · axial · 3.0mm · 0.53mm/px · z∈[-93,+53]mm · 2 of 42 slices shown]
[im 1/42]
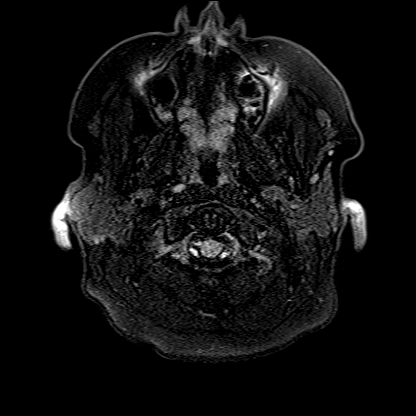
[im 42/42]
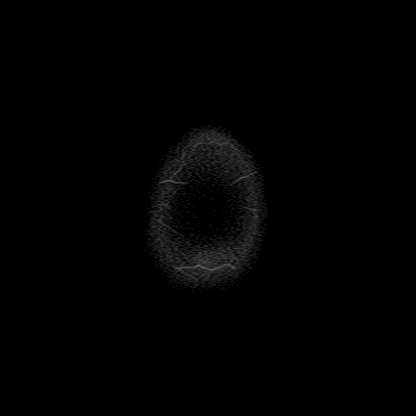

[Series 16: T1 · axial · 1.0mm · 0.98mm/px · z∈[-97,+61]mm · 9 of 160 slices shown (2 of 2)]
[im 1/160]
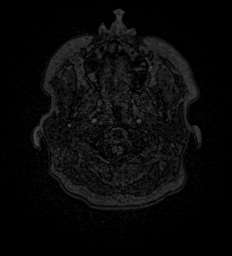
[im 20/160]
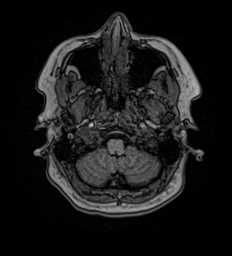
[im 40/160]
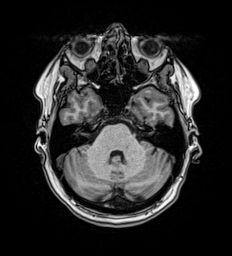
[im 60/160]
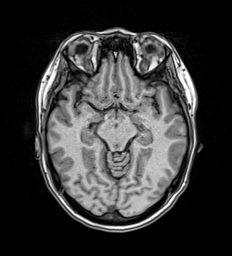
[im 80/160]
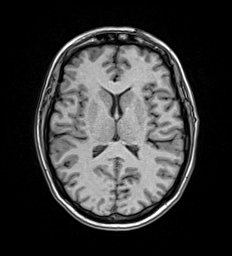
[im 100/160]
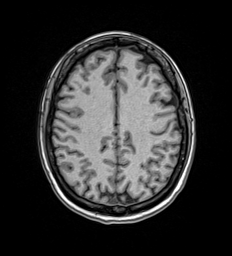
[im 120/160]
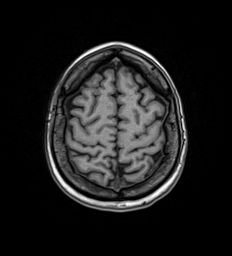
[im 140/160]
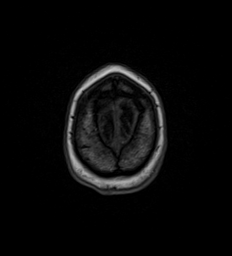
[im 160/160]
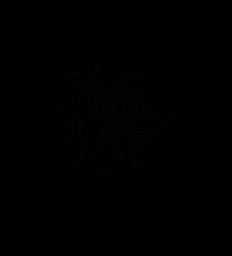

[Series 17: T2 post-contrast · coronal · 5.0mm · 0.57mm/px · 2 of 28 slices shown]
[im 1/28]
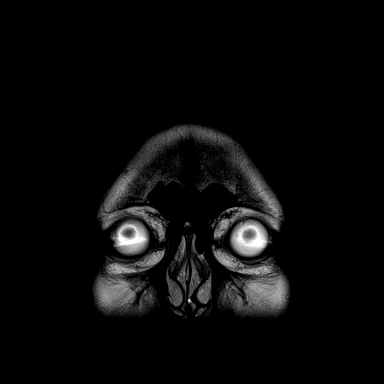
[im 28/28]
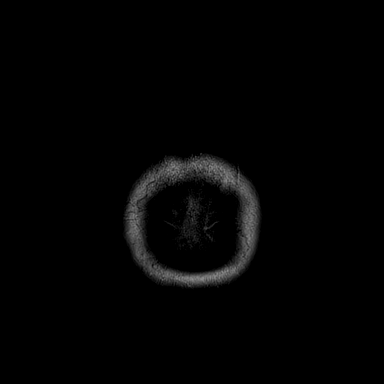

[Series 18: T1 post-contrast · axial · 1.0mm · 0.98mm/px · z∈[-97,+61]mm · 9 of 160 slices shown (1 of 2)]
[im 1/160]
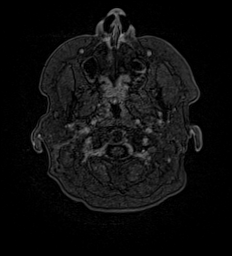
[im 20/160]
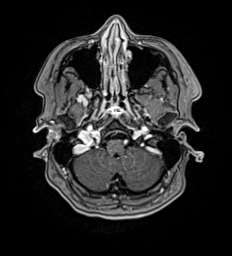
[im 40/160]
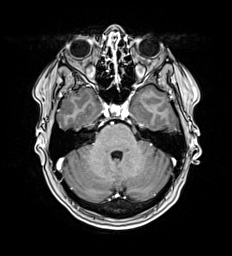
[im 60/160]
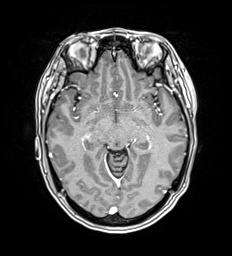
[im 80/160]
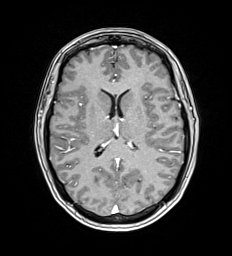
[im 100/160]
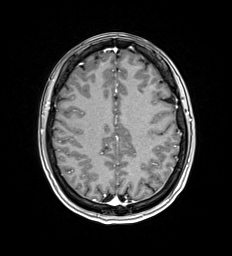
[im 120/160]
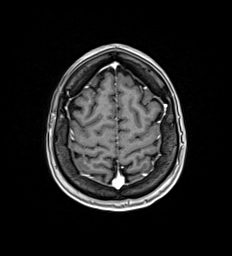
[im 140/160]
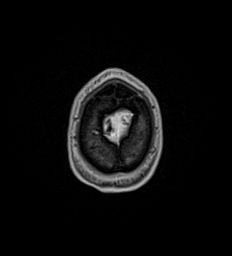
[im 160/160]
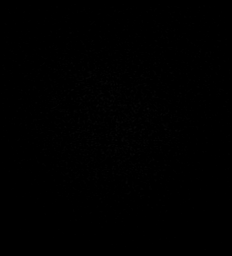

[Series 19: T1 post-contrast · coronal · 5.0mm · 0.57mm/px · 2 of 28 slices shown (2 of 2)]
[im 1/28]
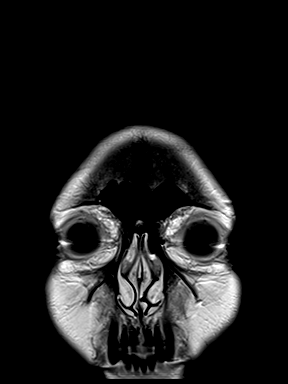
[im 28/28]
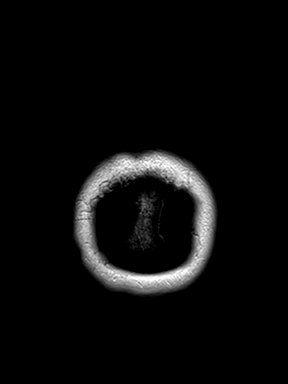

[48 of 48 positions shown; findings below may reference images not displayed]

FINDINGS: Brain: Partially empty sella appearance once again noted.

No restricted diffusion to suggest acute infarction. No midline
shift, mass effect, evidence of mass lesion, ventriculomegaly,
extra-axial collection or acute intracranial hemorrhage.
Cervicomedullary junction is within normal limits.

No abnormal enhancement identified. No dural thickening. No chronic
cerebral blood products or encephalomalacia identified. Gray and
white matter signal remains within normal limits throughout the
brain.

Vascular: Major intracranial vascular flow voids are stable since
[REDACTED]. Dominant left vertebral artery as before.

The major dural venous sinuses are enhancing and appear to be
patent.

There is an effaced appearance of the transverse and sigmoid venous
sinus junctions.

Skull and upper cervical spine: Negative visible cervical spine.
Visualized bone marrow signal is within normal limits.

Sinuses/Orbits: Stable prominent CSF in the bilateral optic nerve
root sleeves. No papilledema is evident. Paranasal sinuses and
mastoids are well aerated.

Other: Visible internal auditory structures appear normal. Negative
visible scalp and face.
IMPRESSION: No acute intracranial abnormality.
Stable MRI appearance of the brain since [REDACTED], remarkable only
for several features which can be seen in the setting of idiopathic
intracranial hypertension (pseudotumor cerebri). Query Papilledema.
CSF opening pressure would best evaluate further.

## 2021-11-24 IMAGING — US US EXTREM LOW VENOUS
1 series · 14 of 24 positions shown · non-contrast
Comparison: None.

CLINICAL DATA: Deep venous thrombosis

Pain
Varicose veins
EXAM:
BILATERAL LOWER EXTREMITY VENOUS DOPPLER ULTRASOUND
TECHNIQUE: Gray-scale sonography with compression, as well as color and duplex
ultrasound, were performed to evaluate the deep venous system(s)
from the level of the common femoral vein through the popliteal and
proximal calf veins.

[Series 1: us venous img lower bilat (dvt) · portal-venous · 14 of 59 slices shown]
[im 1/59]
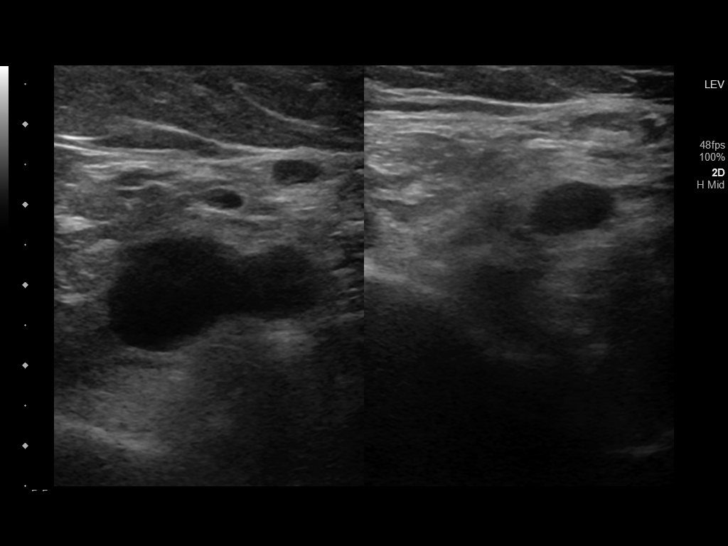
[im 6/59]
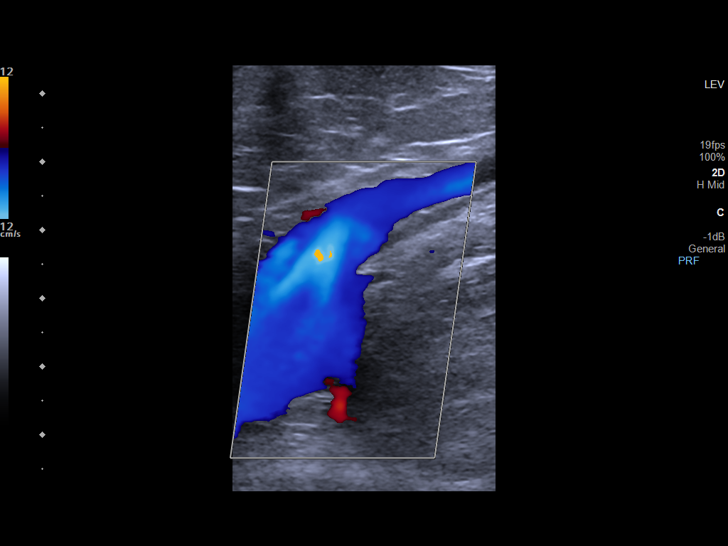
[im 11/59]
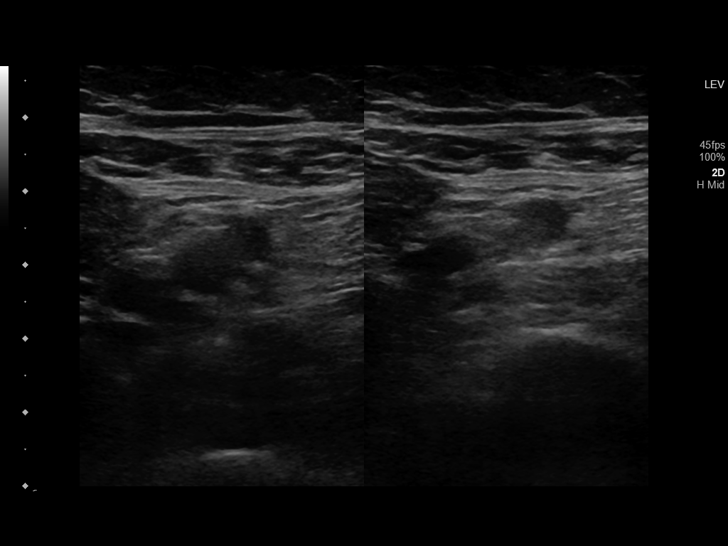
[im 16/59]
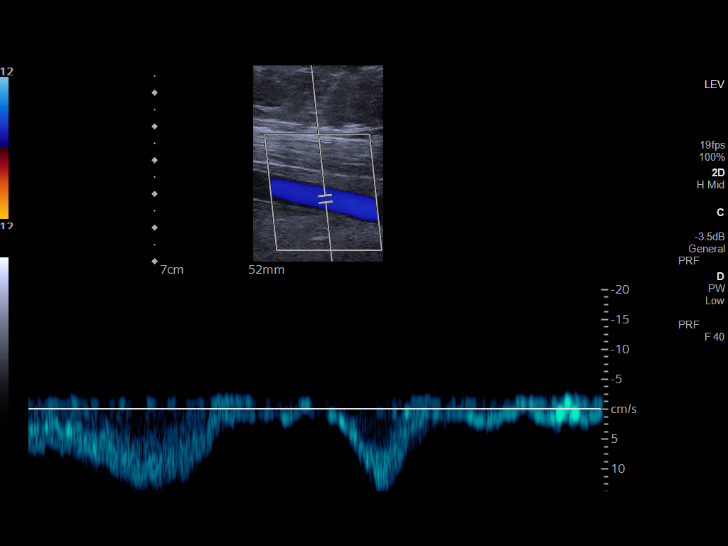
[im 18/59]
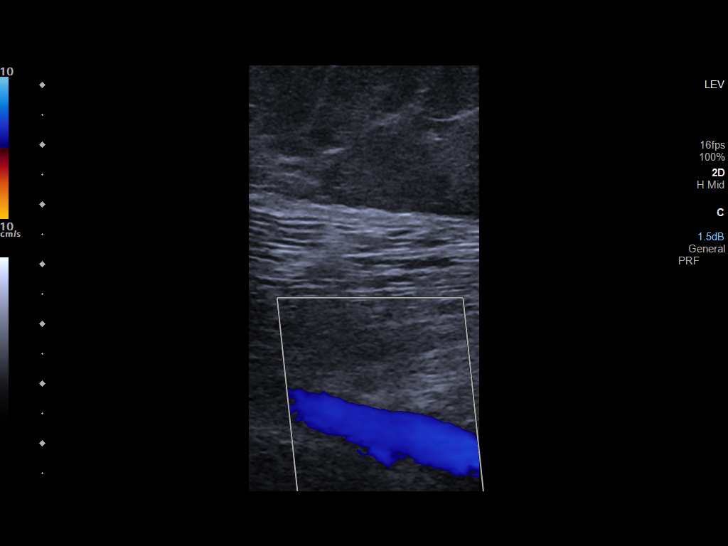
[im 23/59]
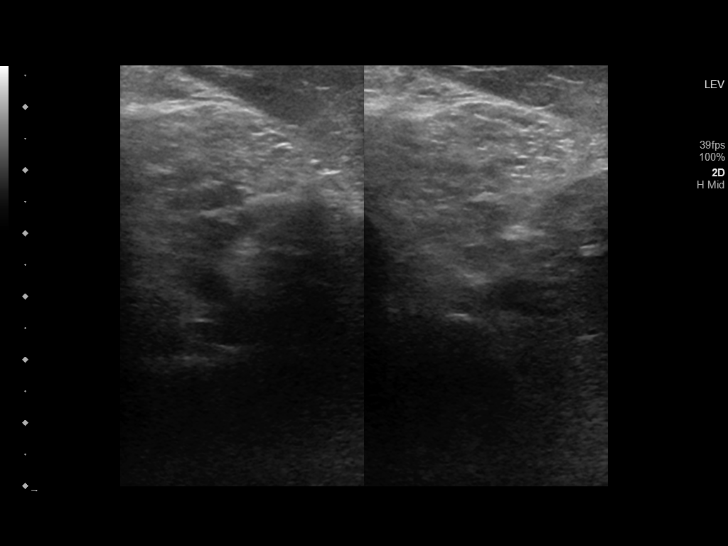
[im 28/59]
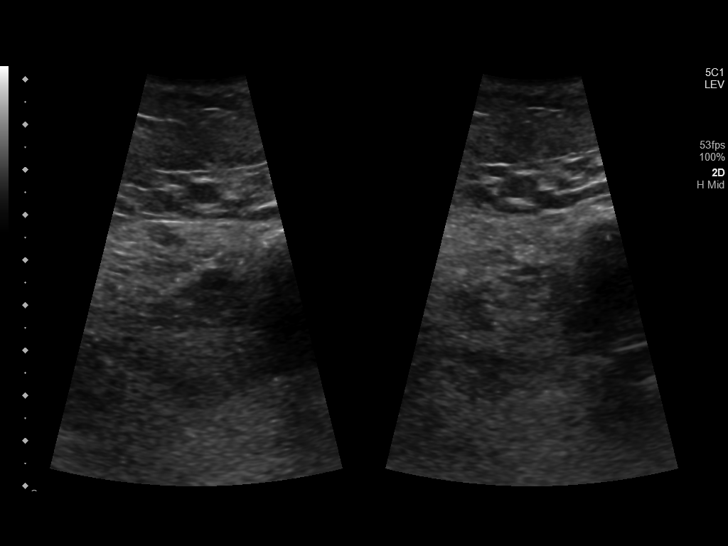
[im 31/59]
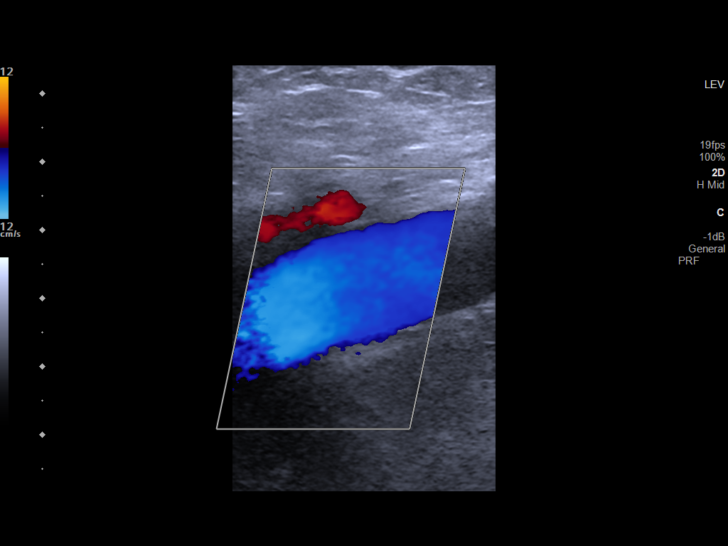
[im 36/59]
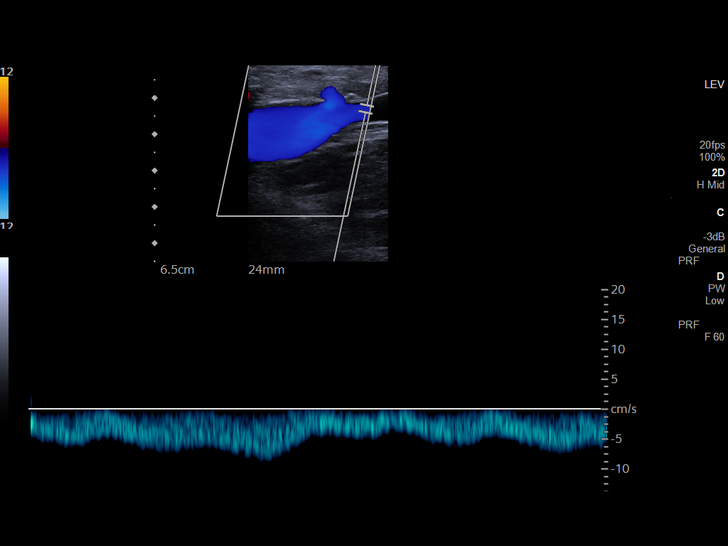
[im 41/59]
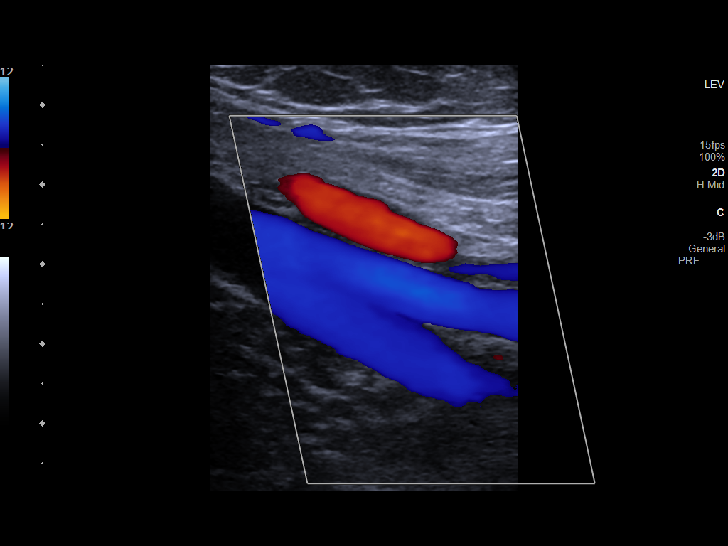
[im 46/59]
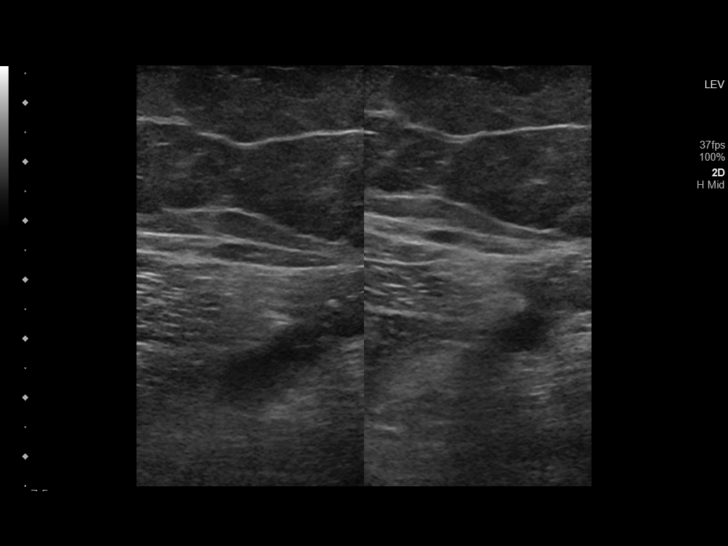
[im 48/59]
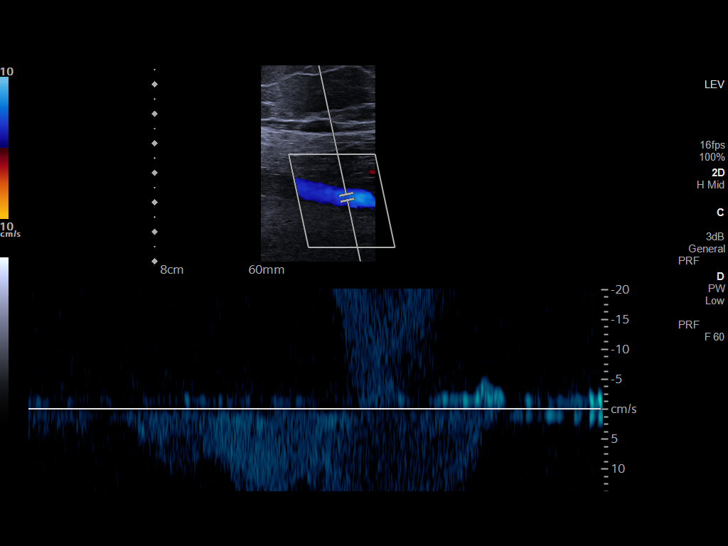
[im 53/59]
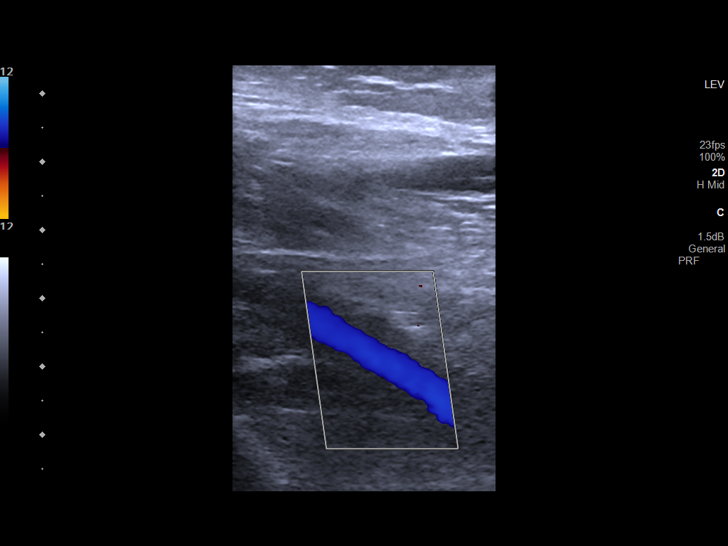
[im 59/59]
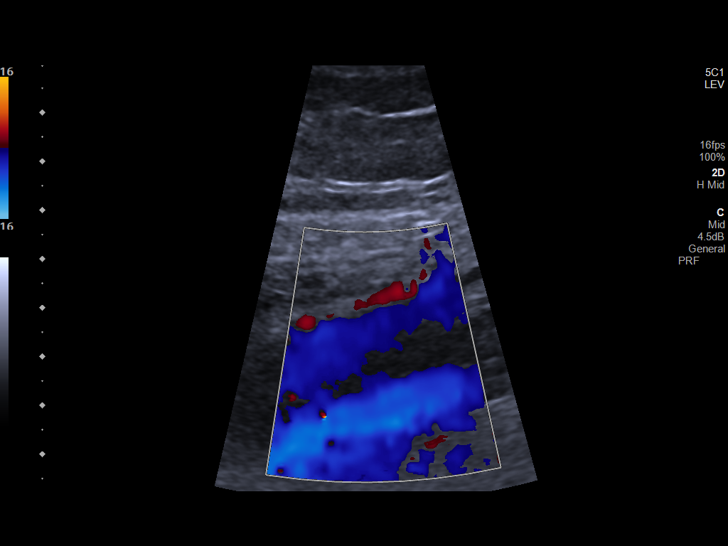

[14 of 24 positions shown; findings below may reference images not displayed]

FINDINGS: VENOUS

Normal compressibility of the common femoral, superficial femoral,
and popliteal veins, as well as the visualized calf veins.
Visualized portions of profunda femoral vein and great saphenous
vein unremarkable. No filling defects to suggest DVT on grayscale or
color Doppler imaging. Doppler waveforms show normal direction of
venous flow, normal respiratory plasticity and response to
augmentation.

OTHER

None.

Limitations: Limited visualization of the left calf veins.
IMPRESSION: No lower extremity DVT.

## 2021-11-24 IMAGING — CT CT HEAD CODE STROKE
4 series · 15 of 47 positions shown, 17 images · non-contrast
Comparison: Brain MRI and head CT, CTA head and neck and CT
Perfusion [DATE].
COMPARISON: Brain MRI and head CT, CTA head and neck and CT
Perfusion [DATE].

Addendum:
CLINICAL DATA: Code stroke. 40-year-old female with neurologic
deficit onset [9Q] hours. Right facial droop and some aphasia.



[Series 3: head bone · axial · 0.47mm/px · z∈[-196,-180]mm · 2 of 82 slices shown]
[im 9/82  bone]
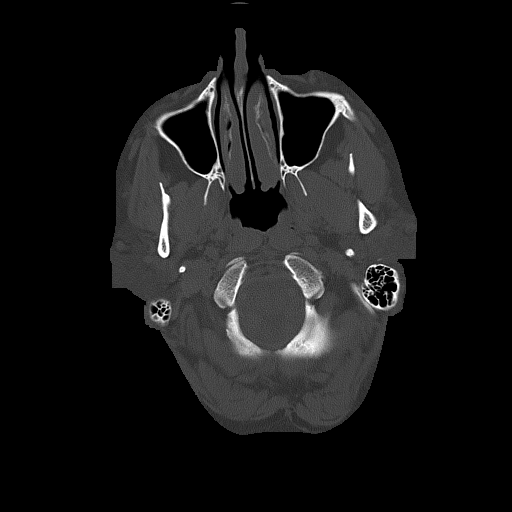
[im 17/82  bone]
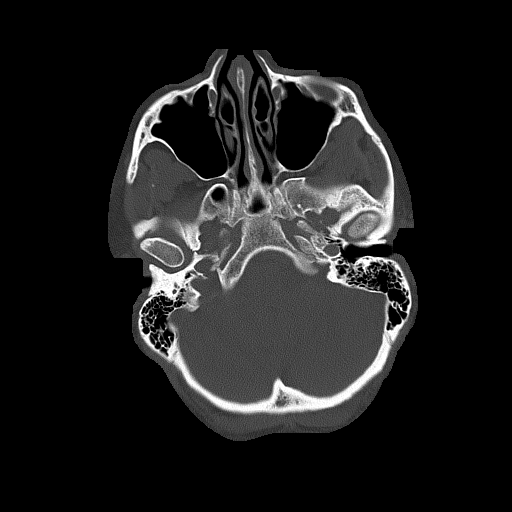

[Series 4: head wo · axial · 0.47mm/px · z∈[-192,-72]mm · 7 of 33 slices shown, 9 images]
[im 5/33  brain]
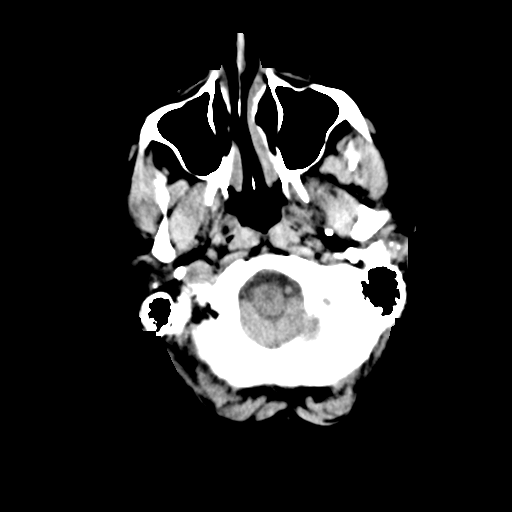
[im 5/33  bone]
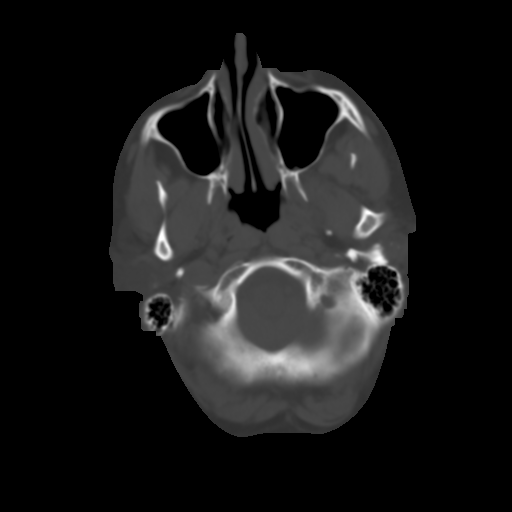
[im 9/33  brain]
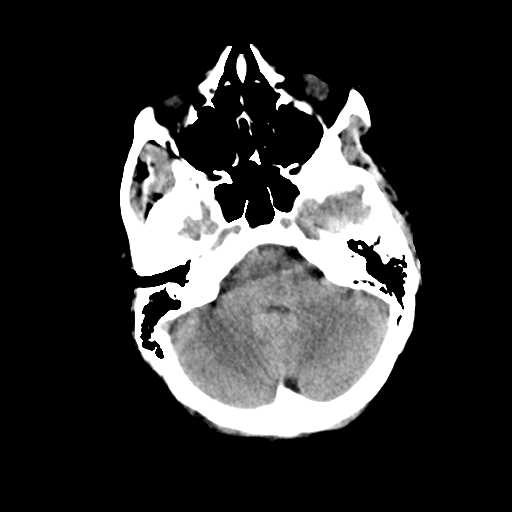
[im 13/33  brain]
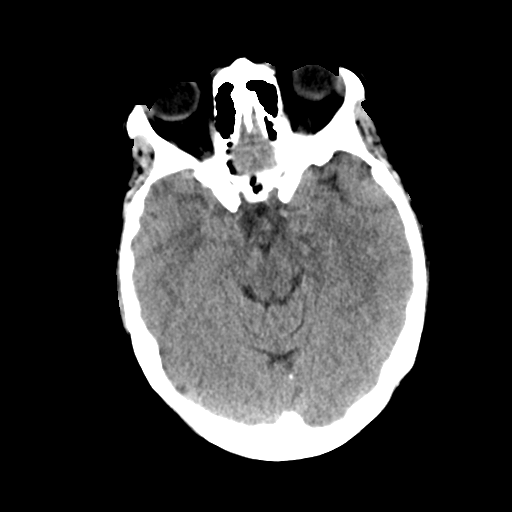
[im 17/33  brain]
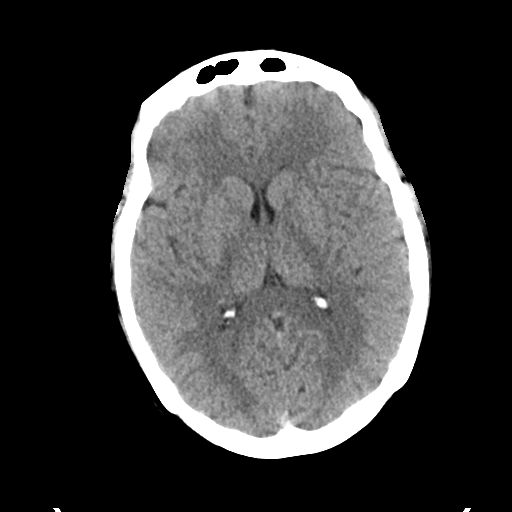
[im 21/33  brain]
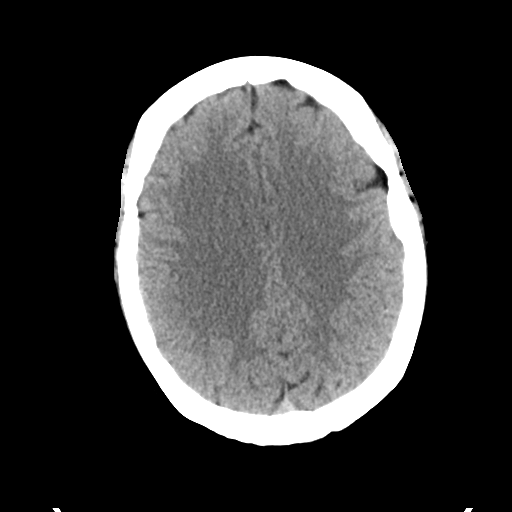
[im 21/33  bone]
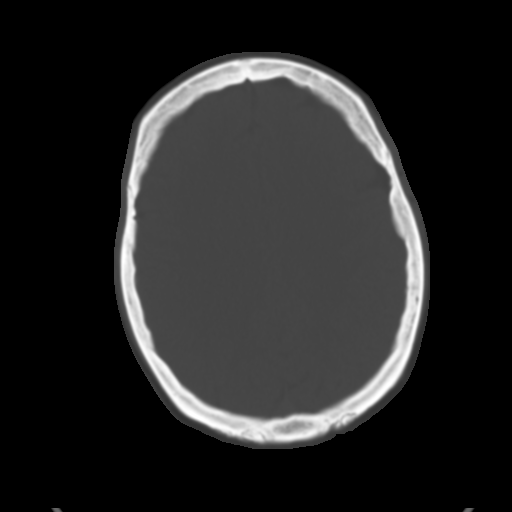
[im 25/33  brain]
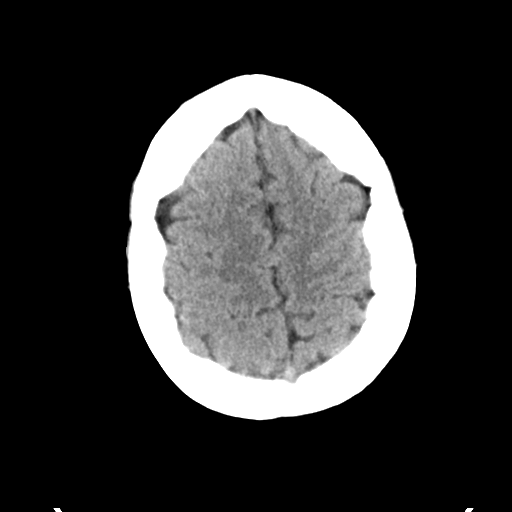
[im 29/33  brain]
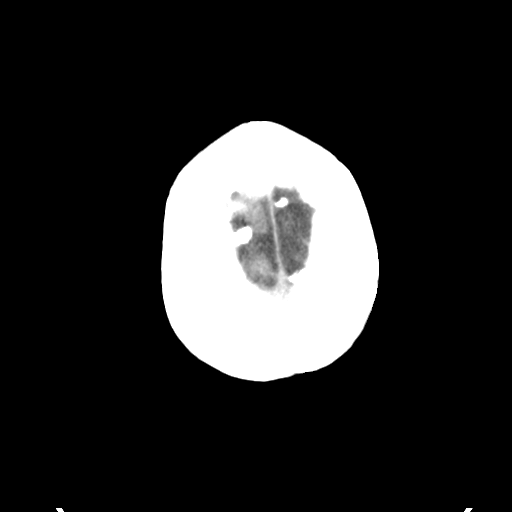

[Series 5: coronal soft tissue · coronal · 0.31mm/px · 3 of 67 slices shown]
[im 23/67  brain]
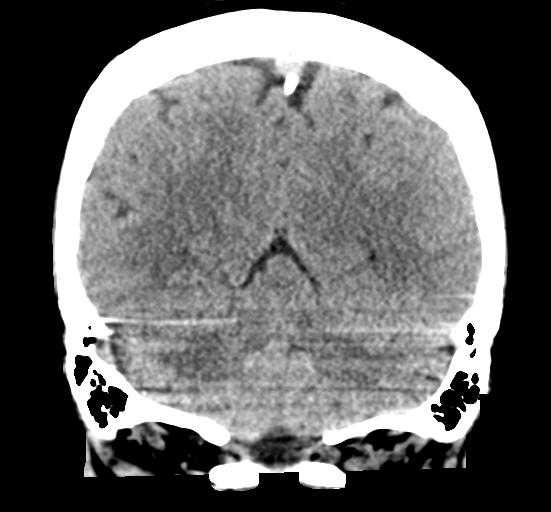
[im 30/67  brain]
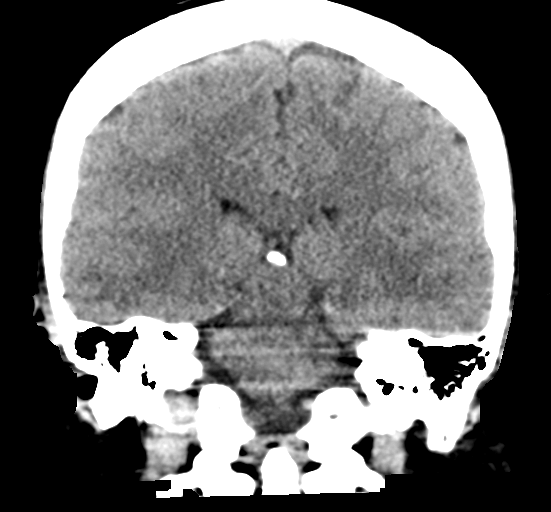
[im 37/67  brain]
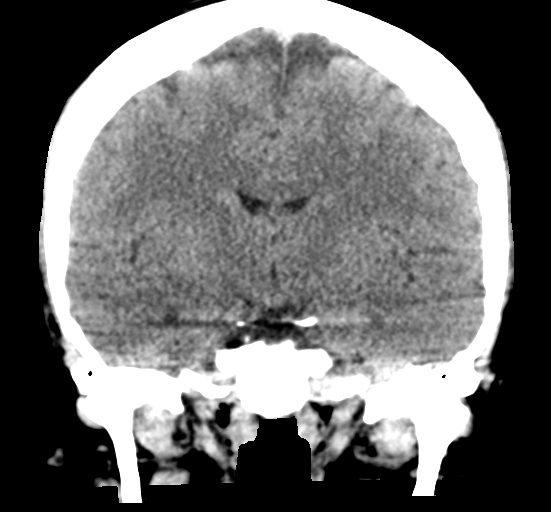

[Series 6: sagittal soft tissue · sagittal · 0.31mm/px · 3 of 57 slices shown]
[im 19/57  brain]
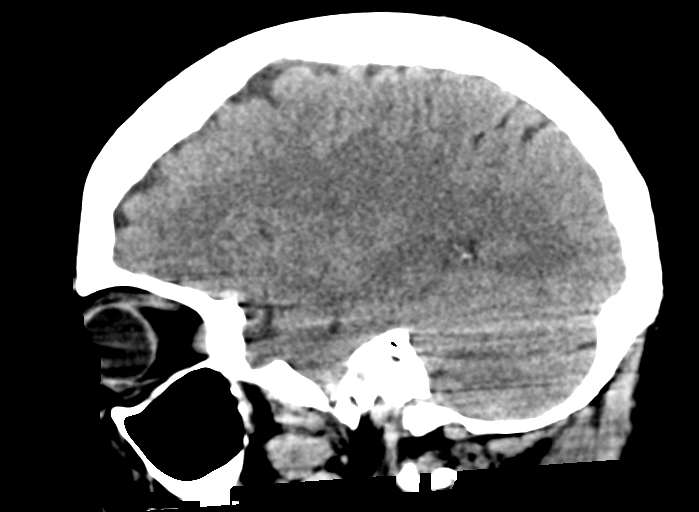
[im 29/57  brain]
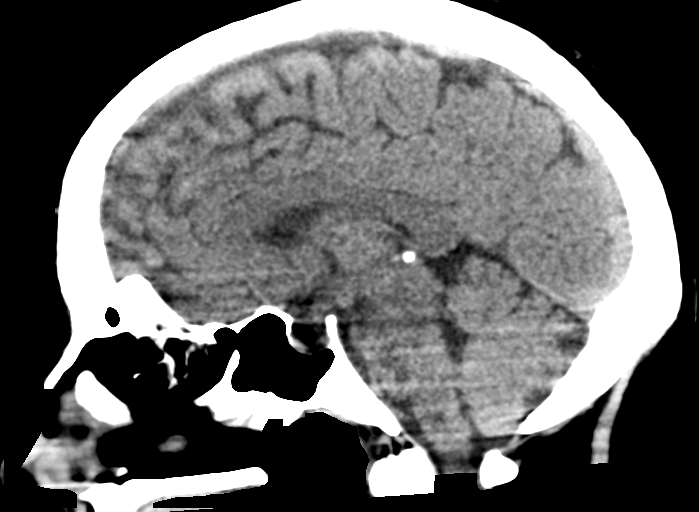
[im 38/57  brain]
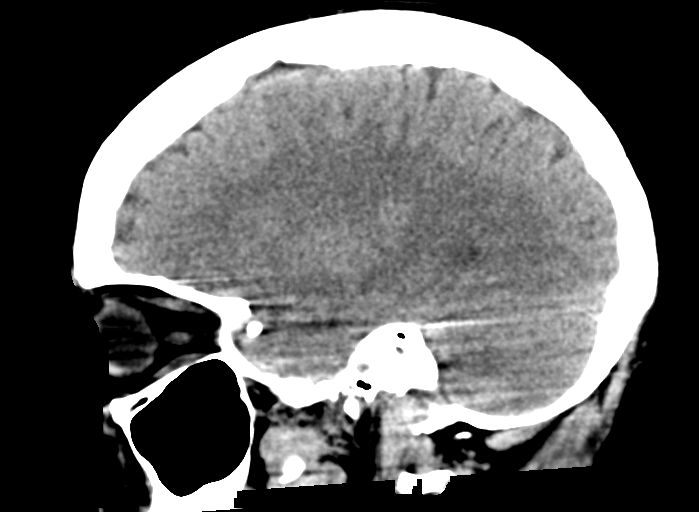

[15 of 47 positions shown; findings below may reference images not displayed]

FINDINGS: Brain: Normal cerebral volume. No midline shift, ventriculomegaly,
mass effect, evidence of mass lesion, intracranial hemorrhage or
evidence of cortically based acute infarction. Gray-white matter
differentiation is within normal limits throughout the brain.
Partially empty sella redemonstrated.

Vascular: No suspicious intracranial vascular hyperdensity.

Skull: No acute osseous abnormality identified.

Sinuses/Orbits: Small right ethmoid sinus osteoma appears
inconsequential. Otherwise Visualized paranasal sinuses and mastoids
are clear.

Other: Mild leftward gaze. Visualized scalp soft tissues are within
normal limits.

ASPECTS (Alberta Stroke Program Early CT Score)

Total score (0-10 with 10 being normal): 10
IMPRESSION: Stable and normal noncontrast CT appearance of the brain. ASPECTS
10.

ADDENDUM:
Study discussed by telephone with Dr. MANNAERTS in the ED on [DATE]
at [9Q] hours.

*** End of Addendum ***
FINDINGS: Brain: Normal cerebral volume. No midline shift, ventriculomegaly,
mass effect, evidence of mass lesion, intracranial hemorrhage or
evidence of cortically based acute infarction. Gray-white matter
differentiation is within normal limits throughout the brain.
Partially empty sella redemonstrated.

Vascular: No suspicious intracranial vascular hyperdensity.

Skull: No acute osseous abnormality identified.

Sinuses/Orbits: Small right ethmoid sinus osteoma appears
inconsequential. Otherwise Visualized paranasal sinuses and mastoids
are clear.

Other: Mild leftward gaze. Visualized scalp soft tissues are within
normal limits.

ASPECTS (Alberta Stroke Program Early CT Score)

Total score (0-10 with 10 being normal): 10
IMPRESSION: Stable and normal noncontrast CT appearance of the brain. ASPECTS
10.

## 2021-11-24 MED ORDER — ENOXAPARIN SODIUM 60 MG/0.6ML IJ SOSY: 0.5000 mg/kg | PREFILLED_SYRINGE | INTRAMUSCULAR | Status: DC

## 2021-11-24 MED ORDER — ASPIRIN EC 81 MG PO TBEC
81.0000 mg | DELAYED_RELEASE_TABLET | Freq: Every day | ORAL | Status: DC
  Administered 2021-11-24 – 2021-11-25 (×2): 81 mg via ORAL
  Filled 2021-11-24 (×2): qty 1

## 2021-11-24 MED ORDER — ACETAMINOPHEN 650 MG RE SUPP: 650.0000 mg | RECTAL | Status: DC | PRN

## 2021-11-24 MED ORDER — METOPROLOL TARTRATE 25 MG PO TABS
25.0000 mg | ORAL_TABLET | Freq: Every day | ORAL | Status: DC
  Administered 2021-11-24 – 2021-11-25 (×2): 25 mg via ORAL
  Filled 2021-11-24 (×2): qty 1

## 2021-11-24 MED ORDER — SODIUM CHLORIDE 0.9% FLUSH
3.0000 mL | Freq: Once | INTRAVENOUS | Status: AC
  Administered 2021-11-24: 3 mL via INTRAVENOUS

## 2021-11-24 MED ORDER — ACETAMINOPHEN 160 MG/5ML PO SOLN
650.0000 mg | ORAL | Status: DC | PRN
  Filled 2021-11-24: qty 20.3

## 2021-11-24 MED ORDER — ACETAMINOPHEN 325 MG PO TABS: 650.0000 mg | ORAL_TABLET | ORAL | Status: DC | PRN

## 2021-11-24 MED ORDER — ENOXAPARIN SODIUM 40 MG/0.4ML IJ SOSY
40.0000 mg | PREFILLED_SYRINGE | INTRAMUSCULAR | Status: DC
  Administered 2021-11-24 – 2021-11-25 (×2): 40 mg via SUBCUTANEOUS
  Filled 2021-11-24 (×2): qty 0.4

## 2021-11-24 MED ORDER — ACETAMINOPHEN 500 MG PO TABS
1000.0000 mg | ORAL_TABLET | Freq: Three times a day (TID) | ORAL | Status: DC | PRN
  Administered 2021-11-24 – 2021-11-25 (×2): 1000 mg via ORAL
  Filled 2021-11-24 (×3): qty 2

## 2021-11-24 MED ORDER — NICOTINE 7 MG/24HR TD PT24
7.0000 mg | MEDICATED_PATCH | Freq: Every day | TRANSDERMAL | Status: DC
  Administered 2021-11-24 – 2021-11-25 (×2): 7 mg via TRANSDERMAL
  Filled 2021-11-24 (×2): qty 1

## 2021-11-24 MED ORDER — STROKE: EARLY STAGES OF RECOVERY BOOK: Freq: Once | Status: AC

## 2021-11-24 MED ORDER — AMLODIPINE BESYLATE 5 MG PO TABS
5.0000 mg | ORAL_TABLET | Freq: Every day | ORAL | Status: DC
  Administered 2021-11-24 – 2021-11-25 (×2): 5 mg via ORAL
  Filled 2021-11-24 (×2): qty 1

## 2021-11-24 MED ORDER — CLOPIDOGREL BISULFATE 75 MG PO TABS
75.0000 mg | ORAL_TABLET | Freq: Every day | ORAL | Status: DC
  Filled 2021-11-24: qty 1

## 2021-11-24 MED ORDER — ALBUTEROL SULFATE (2.5 MG/3ML) 0.083% IN NEBU: 3.0000 mL | INHALATION_SOLUTION | Freq: Four times a day (QID) | RESPIRATORY_TRACT | Status: DC | PRN

## 2021-11-24 MED ORDER — GADOBUTROL 1 MMOL/ML IV SOLN
10.0000 mL | Freq: Once | INTRAVENOUS | Status: AC | PRN
  Administered 2021-11-24: 10 mL via INTRAVENOUS

## 2021-11-24 MED ORDER — LORAZEPAM 2 MG/ML IJ SOLN
1.0000 mg | Freq: Once | INTRAMUSCULAR | Status: AC
  Administered 2021-11-24: 1 mg via INTRAVENOUS
  Filled 2021-11-24: qty 1

## 2021-11-24 MED ORDER — ASPIRIN EC 81 MG PO TBEC: 81.0000 mg | DELAYED_RELEASE_TABLET | Freq: Every day | ORAL | Status: DC

## 2021-11-24 MED ORDER — ATORVASTATIN CALCIUM 20 MG PO TABS
80.0000 mg | ORAL_TABLET | Freq: Every evening | ORAL | Status: DC
  Administered 2021-11-24 – 2021-11-25 (×2): 80 mg via ORAL
  Filled 2021-11-24 (×2): qty 4

## 2021-11-24 NOTE — ED Notes (Signed)
Pt reports hx of stroke 1/24, was  taken OFF of Plavix on 1/31. Pt presents with right sided facial droop, no extremity weakness noted. Intermittent trouble finding words since stroke and also reported by other coworker during her shift tonight.

## 2021-11-24 NOTE — Evaluation (Addendum)
Occupational Therapy Evaluation Patient Details Name: Kayla [name] MRN: [id number] DOB: 07/12/1981 Today's Date: 11/24/2021   History of Present Illness 41 y.o. female with recent complex past medical history including multiple recent TIAs and recent admission to [facility] hospital for CVA for which she received tPA. PMHx includes HTN, recent Covid-19 on 10/12/21. She presents with recurrent episode of aphasia and R facial droop noticed by a coworker.   Clinical Impression   Pt seen for OT evaluation this date. Prior to hospital admission, pt was independent in all aspects of ADL/IADL, working (lab work at hospitals), and driving. Pt lives by herself in a 1 story home with 1 step to enter. She endorses having adult children who are available to assist as needed. Currently pt demonstrates mild impairments in L sided strength and coordination, balance, and cognition/speech (slow processing, some intermittent difficulty with word finding, spelling, confrontational communication), and decreased smoothness with visual scanning R to L impairing her ability to perform mobility and ADL tasks at baseline independence. Pt able to perform ADL/mobility grossly with modified independence. Pt denies sensory deficits. Pt/dtr educated in [facility] ex with handout provided to support improving FMC with goal to return to work and driving. Pt verbalizes understanding and would benefit from additional skilled OT services to maximize recall and carryover of learned strategies as well as to maximize return to PLOF/independence, minimize falls risk, and minimize caregiver burden. Recommend OP OT services upon discharge to address higher level IADL concerns with return to driving and work.    Recommendations for follow up therapy are one component of a multi-disciplinary discharge planning process, led by the attending physician.  Recommendations may be updated based on patient status, additional functional criteria and insurance  authorization.   Follow Up Recommendations  Outpatient OT    Assistance Recommended at Discharge PRN  Patient can return home with the following Assist for transportation    Functional Status Assessment  Patient has had a recent decline in their functional status and demonstrates the ability to make significant improvements in function in a reasonable and predictable amount of time.  Equipment Recommendations  None recommended by OT    Recommendations for Other Services       Precautions / Restrictions Precautions Precautions: Fall Restrictions Weight Bearing Restrictions: No      Mobility Bed Mobility Overal bed mobility: Modified Independent                  Transfers Overall transfer level: Needs assistance Equipment used: None Transfers: Sit to/from Stand Sit to Stand: Supervision                  Balance Overall balance assessment: Needs assistance Sitting-balance support: No upper extremity supported, Feet supported Sitting balance-Leahy Scale: Good     Standing balance support: No upper extremity supported, During functional activity Standing balance-Leahy Scale: Fair                             ADL either performed or assessed with clinical judgement   ADL                                         General ADL Comments: Pt mod indep with basic ADL tasks, requiring increased cognitive effort to perform bilat UE tasks and more challenge with ADL involving higher level dynamic balance  Vision Baseline Vision/History: 1 Wears glasses Ability to See in Adequate Light: 0 Adequate Patient Visual Report: Other (comment) Vision Assessment?: Yes Eye Alignment: Within Functional Limits Ocular Range of Motion: Within Functional Limits Alignment/Gaze Preference: Within Defined Limits Tracking/Visual Pursuits: Decreased smoothness of horizontal tracking;Other (comment);Requires cues, head turns, or add eye shifts to  track (decreased smoothness tracking from R to L, requiring additional eye shifts) Convergence: Within functional limits Visual Fields: No apparent deficits     Perception     Praxis      Pertinent Vitals/Pain Pain Assessment Pain Assessment: No/denies pain     Hand Dominance Right   Extremity/Trunk Assessment Upper Extremity Assessment Upper Extremity Assessment: LUE deficits/detail LUE Deficits / Details: decr FMC per thumb opposition and finger to nose testing, sensation intact, strength 4-/5 shoulder flexion, 4/5 to 4+/5 otherwise LUE Coordination: decreased fine motor   Lower Extremity Assessment Lower Extremity Assessment: Defer to PT evaluation;LLE deficits/detail LLE Deficits / Details: mild coordination deficits noted, sensation intact, strength grossly >4/5   Cervical / Trunk Assessment Cervical / Trunk Assessment: Normal   Communication Communication Communication: Other (comment) (pt endorses mild difficulty with word finding, spelling, conversation (confrontational))   Cognition Arousal/Alertness: Awake/alert Behavior During Therapy: WFL for tasks assessed/performed Overall Cognitive Status: Impaired/Different from baseline Area of Impairment: Problem solving                             Problem Solving: Slow processing       General Comments       Exercises Other Exercises Other Exercises: Pt/dtr educated in [facility] ex with handout provided to support improving FMC with goal to return to work and driving.   Shoulder Instructions      Home Living Family/patient expects to be discharged to:: Private residence Living Arrangements: Alone Available Help at Discharge: Family;Available PRN/intermittently;Other (Comment) Type of Home: House Home Access: Stairs to enter [employer] of Steps: 1 step, no handrail   Home Layout: One level     Bathroom Shower/Tub: Chief Strategy Officer: Standard     Home Equipment:  Agricultural consultant (2 wheels)   Additional Comments: 2WW from previous recent admission      Prior Functioning/Environment Prior Level of Function : Independent/Modified Independent;Driving;Working/employed             Mobility Comments: used 2WW for a few days after recent 1/7 admission, indep since ADLs Comments: indep, works as a traveler doing lab work        OT Problem List: Decreased coordination;Decreased strength;Impaired balance (sitting and/or standing);Impaired UE functional use      OT Treatment/Interventions: Therapeutic activities;Therapeutic exercise;DME and/or AE instruction;Patient/family education;Balance training    OT Goals(Current goals can be found in the care plan section) Acute Rehab OT Goals Patient Stated Goal: return to independence - working, driving OT Goal Formulation: With patient/family Time For Goal Achievement: 12/08/21 Potential to Achieve Goals: Good ADL Goals Pt/caregiver will Perform Home Exercise Program: Left upper extremity;With written HEP provided;Independently (LUE FMC)  OT Frequency: Min 2X/week    Co-evaluation              AM-PAC OT "6 Clicks" Daily Activity     Outcome Measure Help from another person eating meals?: None Help from another person taking care of personal grooming?: None Help from another person toileting, which includes using toliet, bedpan, or urinal?: None Help from another person bathing (including washing, rinsing, drying)?: None Help from  another person to put on and taking off regular upper body clothing?: None Help from another person to put on and taking off regular lower body clothing?: None 6 Click Score: 24   End of Session    Activity Tolerance: Patient tolerated treatment well Patient left: in bed;with call bell/phone within reach;with family/visitor present  OT Visit Diagnosis: Other abnormalities of gait and mobility (R26.89);Hemiplegia and hemiparesis Hemiplegia - Right/Left:  Left Hemiplegia - dominant/non-dominant: Non-Dominant Hemiplegia - caused by: Cerebral infarction                Time: 9476-5465 OT Time Calculation (min): 48 min Charges:  OT General Charges $OT Visit: 1 Visit OT Evaluation $OT Eval Low Complexity: 1 Low OT Treatments $Neuromuscular Re-education: 23-37 mins  Arman Filter., MPH, MS, OTR/L ascom [phone] 11/24/21, 9:47 AM

## 2021-11-24 NOTE — Progress Notes (Signed)
Anticoagulation monitoring(Lovenox):  41 yo female ordered Lovenox 40 mg Q24h    Filed Weights   11/24/21 [id number]  Weight: 123.4 kg (272 lb)   BMI 40.17    Lab Results  Component Value Date   CREATININE 0.80 11/24/2021   CREATININE 0.56 10/26/2021   CREATININE 0.81 10/24/2021   Estimated Creatinine Clearance: 128.9 mL/min (by C-G formula based on SCr of 0.8 mg/dL). Hemoglobin & Hematocrit     Component Value Date/Time   HGB 12.1 11/24/2021 0621   HCT 36.7 11/24/2021 0621     Per Protocol for Patient with estCrcl > 30 ml/min and BMI > 30, will transition to Lovenox 62.5 mg Q24h.

## 2021-11-24 NOTE — Evaluation (Signed)
Physical Therapy Evaluation Patient Details Name: Kayla [name] MRN: [id number] DOB: 09/24/1981 Today's Date: 11/24/2021  History of Present Illness  Pt is a 41 y/o F who presented to the ED with c/o recurrent aphasia & R weakness. MRI was negative for acute intracranial abnormality. 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 On this date, pt is able to complete bed mobility with mod I, sit<>stand from EOB & toilet with independence and initially ambulates in hallway without AD & mod I but PT providing CGA when pt endorses feeling dizzy. Pt assisted back to room & BP checked: 126/89 mmHg MAP 101 in L wrist. Pt also reported "pressure" in L side of head. After rest, pt reports feeling better & requests to use bathroom, ambulating in room/bathroom with independence & independently toileting. 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 MD notified of pt's c/o during session. Will continue to follow pt acutely to address high level balance & endurance.        Recommendations for follow up therapy are one component of a multi-disciplinary discharge planning process, led by the attending physician.  Recommendations may be updated based on patient status, additional functional criteria and insurance authorization.  Follow Up Recommendations Outpatient PT    Assistance Recommended at Discharge PRN  Patient can return home with the following  A little help with walking and/or transfers;A little help with  bathing/dressing/bathroom;Assistance with cooking/housework;Direct supervision/assist for financial management;Assist for transportation    Equipment Recommendations None recommended by PT  Recommendations for Other Services       Functional Status Assessment Patient has had a recent decline in their functional status and demonstrates the ability to make significant improvements in function in a reasonable and predictable amount of time.     Precautions / Restrictions Precautions Precautions: None Restrictions Weight Bearing Restrictions: No      Mobility  Bed Mobility Overal bed mobility: Modified Independent             General bed mobility comments: HOB elevated    Transfers Overall transfer level: Independent Equipment used: None Transfers: Sit to/from Stand Sit to Stand: Independent           General transfer comment: Pt able to transfer on/off toilet independently without assistance    Ambulation/Gait Ambulation/Gait assistance: Min guard, Modified independent (Device/Increase time) (mod I but then PT providing CGA when pt c/o dizziness) Gait Distance (Feet): 100 Feet (+ 20 ft + 20 ft) Assistive device: None   Gait velocity: WNL     General Gait Details: Slightly decreased R hip flexion during swing phase.  Stairs            Wheelchair Mobility    Modified Rankin (Stroke Patients Only)       Balance Overall balance assessment: Needs assistance Sitting-balance support: Feet supported Sitting balance-Leahy Scale: Normal     Standing balance support: No upper extremity supported, During functional activity Standing balance-Leahy Scale: Good  Pertinent Vitals/Pain      Home Living Family/patient expects to be discharged to:: Private residence Living Arrangements: Alone Available Help at Discharge: Family;Available PRN/intermittently Type of Home: House Home Access: Stairs to enter    [employer] of Steps: 1 step, no handrail   Home Layout: One level Home Equipment: Agricultural consultant (2 wheels)      Prior Function Prior Level of Function : Independent/Modified Independent;Driving;Working/employed             Mobility Comments: used 2WW for a few days after recent 1/7 admission, indep since ADLs Comments: indep, works as a traveler doing lab work     Higher education careers adviser   Dominant Hand: Right    Extremity/Trunk Assessment   Upper Extremity Assessment Upper Extremity Assessment: Defer to OT evaluation    Lower Extremity Assessment Lower Extremity Assessment:  (not formally assessed, pt appears to have RLE weakness in functional context during gait as pt with slightly decreased hip flexion during swing phase; BLE heel to shin grossly equal & intact)    Cervical / Trunk Assessment Cervical / Trunk Assessment: Normal  Communication   Communication:  (no issues noted during session)  Cognition Arousal/Alertness: Awake/alert Behavior During Therapy: WFL for tasks assessed/performed Overall Cognitive Status: Within Functional Limits for tasks assessed                                          General Comments General comments (skin integrity, edema, etc.): Pt with continent void on toilet without assistance.    Exercises     Assessment/Plan    PT Assessment Patient needs continued PT services  PT Problem List Decreased mobility;Decreased activity tolerance;Decreased balance       PT Treatment Interventions DME instruction;Therapeutic exercise;Gait training;Balance training;Stair training;Neuromuscular re-education;Functional mobility training;Therapeutic activities;Patient/family education;Manual techniques    PT Goals (Current goals can be found in the Care Plan section)  Acute Rehab PT Goals Patient Stated Goal: figure out what's going on PT Goal Formulation: With patient Time For Goal Achievement: 12/08/21 Potential to  Achieve Goals: Good    Frequency 7X/week     Co-evaluation               AM-PAC PT "6 Clicks" Mobility  Outcome Measure Help needed turning from your back to your side while in a flat bed without using bedrails?: None Help needed moving from lying on your back to sitting on the side of a flat bed without using bedrails?: None Help needed moving to and from a bed to a chair (including a wheelchair)?: None Help needed standing up from a chair using your arms (e.g., wheelchair or bedside chair)?: None Help needed to walk in hospital room?: A Little Help needed climbing 3-5 steps with a railing? : A Little 6 Click Score: 22    End of Session Equipment Utilized During Treatment: Gait belt Activity Tolerance: Treatment limited secondary to medical complications (Comment) Patient left: in bed;with call bell/phone within reach;with family/visitor present Nurse Communication:  (notified MD of c/o head pressure & dizziness during session) PT Visit Diagnosis: Unsteadiness on feet (R26.81);Dizziness and giddiness (R42)    Time: 8416-6063 PT Time Calculation (min) (ACUTE ONLY): 20 min   Charges:   PT Evaluation $PT Eval Moderate Complexity: 1 Mod PT Treatments $Therapeutic Activity: 8-22 mins        Aleda Grana, PT, DPT 11/24/21, 3:45 PM   [place]  Paul Half 11/24/2021, 3:41 PM

## 2021-11-24 NOTE — Consult Note (Addendum)
Neurology Consultation  Reason for Consult: stroke Referring Physician: Dr Francine Graven, [facility]   CC: Stroke like symptoms  History is obtained from:  HPI: Kayla [name] is a 41 y.o. female who has a past medical history of hypertension and tobacco abuse along with history of strokelike symptoms-now 3 times in the past couple of months, for 1 of which she also received IV thrombolysis/TNKase at [place] with negative MRIs, presenting to the emergency department this morning with strokelike symptoms.  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  On examination it was noted that she has a right-sided facial droop-she has an asymmetric face at baseline with right angle of the mouth lower than the left which the patient was then was also verified by looking at her driver's license.  She was seen by telemedicine neurology and due to low NIH stroke scale, she was not given IV thrombolysis but was admitted for further stroke work-up. She has had she had COVID in December after which she started getting headaches off and on.  I asked her specifically if she has had any headaches which she says she has after COVID.  She describes these headaches as intermittent, sometimes feeling like a sensation of stabbing, no visual aura, no photophobia or phonophobia, no transient visual obscurations, cannot tell me any relieving or aggravating factors but does take an ibuprofen or Tylenol once in a while which relieves the headache.  At their worst, the headaches are 10 out of 10.  At this time, the headache was very mild. She was seen in early January with strokelike symptoms with a negative MRI.  She was also seen in the middle of January at [facility] where she was given tPA/TNK for concern for stroke, and ensuing imaging negative for stroke. She has had a detailed stroke in young hypercoagulable panel  done at [facility] which is available in care everywhere-which was negative.  Has a history of hypertension.  Blood pressures have been all over the place according to the patient.  Continues to smoke and is trying to quit.  Denies any illicit drug use.  In terms of family history, brother had clots in his legs and lungs and is on blood thinners-this happened at the age of 28-to the best knowledge of the patient it was unprovoked.  All of the brothers hypercoagulable and genetic tests have been negative according to the patient.  She had a uncle who died of a massive heart attack in his 40s-maternal uncle-cause unknown.  Her MRI is done in our system have been concerning for empty sella, which prompted an ophthalmologic evaluation which she got done at the end of January and was told that her optic disks look normal and there is no papilledema.  I do not have those reports.  This is through verbal communication with the patient.  She continues to deny any positional nature to the headaches or transient visual obscurations.   LKW: 3:55 AM tpa given?: no, evaluated by telemedicine neurology-to mild to treat. Premorbid modified Rankin scale (mRS): 0  ROS: Full ROS was performed and is negative except as noted in the HPI.  History reviewed. No pertinent past medical history.  Hypertension Stroke/TIA like symptoms Possible aborted stroke with tPA in mid January. Tobacco abuse   Family History  Problem Relation Age of Onset   Diabetes Mother    Hypertension Mother  Brother and uncle as detailed above in the HPI   Social History:   reports that she has been smoking cigarettes. She does not have any smokeless tobacco history on file. She reports current alcohol use. No history on file for drug use.  Denies illicit drug use.  Denies binge drinking alcohol.  Socially consumes alcohol.  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  Medications  Current Facility-Administered Medications:    acetaminophen  (TYLENOL) tablet 1,000 mg, 1,000 mg, Oral, Q8H PRN, Agbata, Tochukwu, MD   albuterol (PROVENTIL) (2.5 MG/3ML) 0.083% nebulizer solution 3 mL, 3 mL, Inhalation, Q6H PRN, Agbata, Tochukwu, MD   amLODipine (NORVASC) tablet 5 mg, 5 mg, Oral, Daily, Agbata, Tochukwu, MD   aspirin EC tablet 81 mg, 81 mg, Oral, Daily, Agbata, Tochukwu, MD   atorvastatin (LIPITOR) tablet 80 mg, 80 mg, Oral, QPM, Agbata, Tochukwu, MD   clopidogrel (PLAVIX) tablet 75 mg, 75 mg, Oral, Daily, Agbata, Tochukwu, MD   metoprolol tartrate (LOPRESSOR) tablet 25 mg, 25 mg, Oral, Daily, Agbata, Tochukwu, MD   nicotine (NICODERM CQ - dosed in mg/24 hr) patch 7 mg, 7 mg, Transdermal, Daily, Agbata, Tochukwu, MD   Exam: Current vital signs: BP 122/87 (BP Location: Left Arm)    Pulse 99    Temp 97.9 F (36.6 C)    Resp 18    Ht 5\' 9"  (1.753 m)    Wt 123.4 kg    SpO2 100%    BMI 40.17 kg/m  Vital signs in last 24 hours: Temp:  [97.6 F (36.4 C)-98.5 F (36.9 C)] 97.9 F (36.6 C) (02/06 0731) Pulse Rate:  [91-123] 99 (02/06 0731) Resp:  [18-24] 18 (02/06 0731) BP: (122-144)/(80-112) 122/87 (02/06 0731) SpO2:  [100 %] 100 % (02/06 0731) Weight:  [123.4 kg] 123.4 kg (02/06 0637) GENERAL: Awake, alert in NAD HEENT: - Normocephalic and atraumatic, dry mm, no LN++, no Thyromegally LUNGS - Clear to auscultation bilaterally with no wheezes CV - S1S2 RRR, no m/r/g, equal pulses bilaterally. ABDOMEN - Soft, nontender, nondistended with normoactive BS Ext: warm, well perfused, intact peripheral pulses, both legs have nonpitting edema  NEURO:  Mental Status: AA&Ox3  Language: speech is clear with no dysarthria.  Naming, repetition, fluency, and comprehension intact. Cranial Nerves: PERRL. EOMI, visual fields full, addressed, face looks mildly asymmetric with the right angle of the mild drooping down-patient says this is normal for her, on smiling, there is no asymmetry, nasolabial folds also have a mild asymmetry with the right  nasolabial fold slightly flat appearing-this is baseline per the patient, facial sensation intact, hearing intact, tongue/uvula/soft palate midline, normal sternocleidomastoid and trapezius muscle strength. No evidence of tongue atrophy or fibrillations Motor: 5/5 in all fours, no drift Tone: is normal and bulk is normal Sensation- Intact to light touch bilaterally, no extinction Coordination: FTN intact bilaterally, no ataxia in BLE. Gait- deferred  NIHSS--0   Labs I have reviewed labs in epic and the results pertinent to this consultation are:   CBC    Component Value Date/Time   WBC 8.6 11/24/2021 0621   RBC 3.97 11/24/2021 0621   HGB 12.1 11/24/2021 0621   HCT 36.7 11/24/2021 0621   PLT 325 11/24/2021 0621   MCV 92.4 11/24/2021 0621   MCH 30.5 11/24/2021 0621   MCHC 33.0 11/24/2021 0621   RDW 13.0 11/24/2021 0621   LYMPHSABS 3.2 11/24/2021 0411   MONOABS 0.7 11/24/2021 0411   EOSABS 0.2 11/24/2021 0411   BASOSABS 0.1 11/24/2021 0411    CMP  Component Value Date/Time   NA 138 11/24/2021 0411   K 3.6 11/24/2021 0411   CL 109 11/24/2021 0411   CO2 22 11/24/2021 0411   GLUCOSE 116 (H) 11/24/2021 0411   BUN 18 11/24/2021 0411   CREATININE 0.80 11/24/2021 0411   CALCIUM 9.2 11/24/2021 0411   PROT 7.4 11/24/2021 0411   ALBUMIN 4.0 11/24/2021 0411   AST 34 11/24/2021 0411   ALT 30 11/24/2021 0411   ALKPHOS 64 11/24/2021 0411   BILITOT 0.5 11/24/2021 0411   GFRNONAA >60 11/24/2021 0411   Lipid Panel     Component Value Date/Time   CHOL 171 10/25/2021 0543   TRIG 222 (H) 10/25/2021 0543   HDL 37 (L) 10/25/2021 0543   CHOLHDL 4.6 10/25/2021 0543   VLDL 44 (H) 10/25/2021 0543   LDLCALC 90 10/25/2021 0543   2D echo 10/25/2021 at [facility] with 60 to 65% LVEF, normal mitral valve, normal aortic valve, no atrial level shunt by color-flow Doppler.  Imaging I have reviewed the images obtained:  CT-head-no acute changes  MRI examination of the brain-no  acute stroke.  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  CT angiography head and neck and CT perfusion study-normal CT head and neck, negative CT perfusion.  Outside chart review from [facility] from 11/12/2021-normal MRI brain without contrast CT angiography of the head and neck on 11/11/2021-grossly normal anterior and posterior circulation with mild artifact at the mid left common carotid. Hypercoagulable panel negative    Assessment: Ms. Fujitani is a 41 year old woman who has now had multiple episode of strokelike symptoms with left-sided facial numbness, difficulty with her speech and balance along with dizziness, for 1 of these episodes also received IV thrombolysis with negative MRIs-the episode with IV thrombolysis deemed to be aborted stroke due to IV thrombolysis, presenting again with left-sided facial numbness, difficulty with speech and possible right-sided facial droop-which is likely baseline asymmetry of the right angle of her face. Some of these episodes have had headache associated with it.  I could not gather a good connection of the headache with these neurological symptoms.  She also does not have any positional nature to this headache as well as no transient visual obscurations.  MRI was concerning for empty sella which might be seen in idiopathic intracranial hypertension but lack of her symptoms as well as an outpatient ophthalmological evaluation with no evidence of papilledema make idiopathic intracranial hypertension less likely. That said, her strokelike symptoms which have been either an aborted stroke or TIA would warrant further work-up in the form of a TEE. She has had extensive work-up including lab work, multiple MRIs of the brain, imaging of head and neck blood vessels as well as hypercoagulable panel which have all been unremarkable.  Recommendations: -At this time, I would recommend continuing aspirin -I would also recommend a  TEE. -Given that she has a history that does not go very well with idiopathic intracranial hypertension and no evidence of papilledema on an ophthalmological examination done in late January, I am not very inclined to do an LP but one should be considered outpatient if her headaches continue to be a problem.  If she so chooses, we can request radiology to do a fluoroscopy guided LP while she is inpatient as well.  That would mostly be to rule out idiopathic intracranial hypertension, which is already low on differentials. -As far as her stroke symptom etiology, I do not have a  very good handle on why that might be happening but given her family history of clotting in the young and her recent COVID infection, a TEE is reasonable to r/o intracardiac source of emboli that might be causing stroke like symptoms. -B/l LE dopplers -High intensity statin for goal LDL goal less than 70  Plan was discussed with the admitting hospitalist via secure chat  -- Amie Portland, MD Neurologist [facility] Pager: [phone]

## 2021-11-24 NOTE — TOC Initial Note (Signed)
Transition of Care [facility]) - Initial/Assessment Note    Patient Details  Name: Kayla [name] MRN: [id number] Date of Birth: 03-20-1981  Transition of Care [facility]) CM/SW Contact:    Pete Pelt, RN Phone Number: 11/24/2021, 12:57 PM  Clinical Narrative:   Patient lives at home alone, states she has a good support system in place. Denies concerns about transportation or medications.    Patient is active with PCP, and up to date on all appointments, amenable to outpatient therapy.   TOC contact information provided to patient,                 Expected Discharge Plan: OP Rehab Barriers to Discharge: Continued Medical Work up   Patient Goals and CMS Choice Patient states their goals for this hospitalization and ongoing recovery are:: To find out what is wrong with me and stay as healthy as I can.   Choice offered to / list presented to : NA  Expected Discharge Plan and Services Expected Discharge Plan: OP Rehab In-house Referral: Chaplain (Chaplain at Bedside during assessment) Discharge Planning Services: CM Consult Post Acute Care Choice: NA Living arrangements for the past 2 months: Single Family Home                                      Prior Living Arrangements/Services Living arrangements for the past 2 months: Single Family Home Lives with:: Self Patient language and need for interpreter reviewed:: Yes (No interpreter required) Do you feel safe going back to the place where you live?: Yes      Need for Family Participation in Patient Care: Yes (Comment) Care giver support system in place?: Yes (comment)   Criminal Activity/Legal Involvement Pertinent to Current Situation/Hospitalization: No - Comment as needed  Activities of Daily Living Home Assistive Devices/Equipment: Eyeglasses, Walker (specify type) ADL Screening (condition at time of admission) Patient's cognitive ability adequate to safely complete daily activities?: Yes Is the patient deaf or have  difficulty hearing?: Yes Does the patient have difficulty seeing, even when wearing glasses/contacts?: No Does the patient have difficulty concentrating, remembering, or making decisions?: Yes Patient able to express need for assistance with ADLs?: Yes Does the patient have difficulty dressing or bathing?: No Independently performs ADLs?: Yes (appropriate for developmental age) Does the patient have difficulty walking or climbing stairs?: No Weakness of Legs: None Weakness of Arms/Hands: None  Permission Sought/Granted Permission sought to share information with : Case Manager Permission granted to share information with : Yes, Verbal Permission Granted     Permission granted to share info w AGENCY: Outpatient therapy        Emotional Assessment Appearance:: Appears stated age Attitude/Demeanor/Rapport: Gracious, Engaged Affect (typically observed): Pleasant, Appropriate Orientation: : Oriented to Self, Oriented to Place, Oriented to  Time, Oriented to Situation Alcohol / Substance Use: Not Applicable Psych Involvement: No (comment)  Admission diagnosis:  TIA (transient ischemic attack) [G45.9] Patient Active Problem List   Diagnosis Date Noted   History of CVA (cerebrovascular accident) 11/24/2021   Obesity, Class III, BMI 40-49.9 (morbid obesity) ([place])    Facial paresthesia 10/25/2021   Essential hypertension 10/25/2021   Tobacco abuse 10/25/2021   Hypotension    TIA (transient ischemic attack)    Facial weakness    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

## 2021-11-24 NOTE — ED Provider Notes (Signed)
[facility] Provider Note    Event Date/Time   First MD Initiated Contact with Patient 11/24/21 [phone]     (approximate)   History   Code Stroke   HPI  Kayla [name] is a 41 y.o. female with recent complex past medical history including multiple recent TIAs and recent admission to [facility] hospital for CVA for which she received tPA.  She is not currently taking Plavix.  Tonight at work shortly prior to coming downstairs to the ER patient was stated that she was feeling "shaky "and coworkers noted that she had a facial droop and was waving her hands because she was not making any sense or able to speak.  On arrival she is protecting her airway.  Code stroke was called she was taken immediately to CT scanner.     Physical Exam   Triage Vital Signs: ED Triage Vitals  Enc Vitals Group     BP 11/24/21 0408 (!) 134/112     Pulse Rate 11/24/21 0408 (!) 123     Resp 11/24/21 0408 18     Temp 11/24/21 0408 98.5 F (36.9 C)     Temp Source 11/24/21 0408 Oral     SpO2 11/24/21 0408 100 %     Weight --      Height --      Head Circumference --      Peak Flow --      Pain Score 11/24/21 0411 0     Pain Loc --      Pain Edu? --      Excl. in GC? --     Most recent vital signs: Vitals:   11/24/21 0455 11/24/21 0500  BP: (!) 134/93 (!) 144/86  Pulse: 99 95  Resp: (!) 24 19  Temp:    SpO2: 100% 100%     Constitutional: Alert  Eyes: Conjunctivae are normal.  Head: Atraumatic. Nose: No congestion/rhinnorhea. Mouth/Throat: Mucous membranes are moist.   Neck: Painless ROM.  Cardiovascular:   Good peripheral circulation. Respiratory: Normal respiratory effort.  No retractions.  Gastrointestinal: Soft and nontender.  Musculoskeletal:  no deformity Neurologic:  CN- intact.  right facial droop, Normal FNF.  Normal heel to shin.  Sensation intact bilaterally. Normal speech and language. No other gross focal neurologic deficits are appreciated.  Skin:  Skin  is warm, dry and intact. No rash noted. Psychiatric: Mood and affect are normal. Speech and behavior are normal.    ED Results / Procedures / Treatments   Labs (all labs ordered are listed, but only abnormal results are displayed) Labs Reviewed  COMPREHENSIVE METABOLIC PANEL - Abnormal; Notable for the following components:      Result Value   Glucose, Bld 116 (*)    All other components within normal limits  RESP PANEL BY RT-PCR (FLU A&B, COVID) ARPGX2  PROTIME-INR  APTT  CBC  DIFFERENTIAL  CBG MONITORING, ED  POC URINE PREG, ED     EKG  ED ECG REPORT I, Willy Eddy, the attending physician, personally viewed and interpreted this ECG.   Date: 11/24/2021  EKG Time: 4:27  Rate: 95  Rhythm: sinus  Axis: normal  Intervals:normal   ST&T Change: no stemi, no depression    RADIOLOGY Please see ED Course for my review and interpretation.  I personally reviewed all radiographic images ordered to evaluate for the above acute complaints and reviewed radiology reports and findings.  These findings were personally discussed with the patient.  Please see  medical record for radiology report.    PROCEDURES:  Critical Care performed: No  Procedures   MEDICATIONS ORDERED IN ED: Medications  sodium chloride flush (NS) 0.9 % injection 3 mL (3 mLs Intravenous Given 11/24/21 0449)  LORazepam (ATIVAN) injection 1 mg (1 mg Intravenous Given 11/24/21 0519)     IMPRESSION / MDM / ASSESSMENT AND PLAN / ED COURSE  I reviewed the triage vital signs and the nursing notes.                              Differential diagnosis includes, but is not limited to, cva, tia, hypoglycemia, dehydration, electrolyte abnormality, dissection, sepsis  Patient presented with symptoms as described above.  On my evaluation upon coming to room 18 the patient is clearly able to answer questions and has clear fluent speech.  Does have some right facial droop but denies any other weakness.  Moving  all extremities has good tone and good strength.  States that she feels significantly better.   Clinical Course as of 11/24/21 Veto [place]ps Nov 24, 2021  0932 CT imaging by my review does not show any evidence of mass or subdural. [PR]  0459 I discussed the case in consultation with neurologist who is recommending admission for MRI with and without contrast as well as TEE and possible EEG and neurology consultation.  I will consult hospitalist for admission.  Patient agreeable to plan. [PR]  0522 Patient discussed in consultation with hospitalist who is agreed to admit patient to their service. [PR]    Clinical Course User Index [PR] Willy Eddy, MD     FINAL CLINICAL IMPRESSION(S) / ED DIAGNOSES   Final diagnoses:  TIA (transient ischemic attack)     Rx / DC Orders   ED Discharge Orders     None        Note:  This document was prepared using Dragon voice recognition software and may include unintentional dictation errors.    Willy Eddy, MD 11/24/21 Jeralyn Bennett

## 2021-11-24 NOTE — ED Notes (Signed)
CODE STROKE CALLED TO CARELINK (JAIME)

## 2021-11-24 NOTE — ED Notes (Signed)
Pt to MRI

## 2021-11-24 NOTE — ED Notes (Signed)
Dr. Para March messaged to notify of passed stroke swallow screen and request order for appropriate diet.

## 2021-11-24 NOTE — ED Notes (Signed)
EDP at bedside  

## 2021-11-24 NOTE — ED Notes (Signed)
Pt taken to floor by ED tech from MRI. All of belongings including purse sent with patient to floor.

## 2021-11-24 NOTE — Progress Notes (Signed)
°   11/24/21 1230  Clinical Encounter Type  Visited With Patient and family together  Visit Type Initial;Spiritual support  Spiritual Encounters  Spiritual Needs Prayer   Chaplain followed up to visit Code Stroke patient who was admitted in the middle of the night. Chaplain facilitated support through compassionate presence and conversation and prayer.

## 2021-11-24 NOTE — H&P (Signed)
History and Physical    Patient: Kayla [name] LPF:[id number] DOB: Jul 15, 1981 DOA: 11/24/2021 DOS: the patient was seen and examined on 11/24/2021 PCP: Zada Finders, PA-C  Patient coming from: Home  Chief Complaint:  Chief Complaint  Patient presents with   Code Stroke    HPI: Kayla [name] is a 41 y.o. female with medical history significant for multiple TIAs, recent CVA status post tPA administration on 11/11/21, nicotine dependence, morbid obesity, hypertension who presents to the ER for evaluation of right-sided facial droop and aphasia. Patient had a recent stroke and states that she initially had left-sided weakness which has improved.  She needed a rolling walker upon discharge but currently is ambulatory without an assistive device. She has recently seen by neurologist who discontinued her Plavix on 01/31 and cleared her for return to work. Patient states symptoms started at about 3:35 AM with an occipital headache, nausea, dizziness, lightheadedness and word-finding difficulty. Her coworker immediately took her to the ER for further evaluation. She denies having any blurry vision and had no unilateral weakness.  She denied having any chest pain, no shortness of breath, no abdominal pain, no changes in her bowel habits, no fever, no chills, no cough, no palpitations, no diaphoresis, no focal deficit or blurred vision. Patient had COVID-19 viral infection in December, 2022. Code stroke was called and patient was taken immediately to CT. At the time of my evaluation his symptoms have resolved.  Review of Systems: As mentioned in the history of present illness. All other systems reviewed and are negative. History reviewed. No pertinent past medical history. Past Surgical History:  Procedure Laterality Date   APPENDECTOMY     CHOLECYSTECTOMY     COLONOSCOPY     DILATION AND CURETTAGE OF UTERUS     ESOPHAGOGASTRODUODENOSCOPY     KNEE SURGERY     TUBAL LIGATION     uterine  ablation     Social History:  reports that she has been smoking cigarettes. She does not have any smokeless tobacco history on file. She reports current alcohol use. No history on file for drug use.  Allergies  Allergen Reactions   Eszopiclone    Levaquin [Levofloxacin] Other (See Comments)    paranoid   Sulfa Antibiotics Rash    Family History  Problem Relation Age of Onset   Diabetes Mother    Hypertension Mother     Prior to Admission medications   Medication Sig Start Date End Date Taking? Authorizing Provider  atorvastatin (LIPITOR) 40 MG tablet Take 40 mg by mouth daily. 11/18/21  Yes [provider]  acetaminophen (TYLENOL) 500 MG tablet Take 1,000 mg by mouth every 8 (eight) hours as needed for mild pain.    [provider]  albuterol (VENTOLIN HFA) 108 (90 Base) MCG/ACT inhaler Inhale 2 puffs into the lungs every 6 (six) hours as needed for wheezing or shortness of breath.    [provider]  amLODipine (NORVASC) 5 MG tablet Take 5 mg by mouth daily. 11/19/21   [provider]  aspirin EC 81 MG tablet Take 1 tablet (81 mg total) by mouth daily. Swallow whole. 10/25/21   Alford Highland, MD  atorvastatin (LIPITOR) 80 MG tablet Take 1 tablet (80 mg total) by mouth daily. 10/26/21   Alford Highland, MD  metoprolol tartrate (LOPRESSOR) 25 MG tablet Take 25 mg by mouth daily. 11/14/21   [provider]    Physical Exam: Vitals:   11/24/21 0500 11/24/21 5329 11/24/21 9242  11/24/21 0731  BP: (!) 144/86 (!) 143/104  122/87  Pulse: 95 97  99  Resp: 19   18  Temp:  97.6 F (36.4 C)  97.9 F (36.6 C)  TempSrc:  Oral    SpO2: 100% 100%  100%  Weight:   123.4 kg   Height:   5\' 9"  (1.753 m)    Physical Exam Vitals and nursing note reviewed.  Constitutional:      Appearance: She is obese.  HENT:     Head: Normocephalic and atraumatic.     Nose: Nose normal.     Mouth/Throat:     Mouth: Mucous membranes are moist.  Eyes:      Conjunctiva/sclera: Conjunctivae normal.  Cardiovascular:     Rate and Rhythm: Normal rate and regular rhythm.  Pulmonary:     Effort: Pulmonary effort is normal.  Abdominal:     General: Bowel sounds are normal.     Palpations: Abdomen is soft.     Comments: Central adiposity  Musculoskeletal:     Cervical back: Normal range of motion and neck supple.  Skin:    General: Skin is warm and dry.  Neurological:     General: No focal deficit present.     Mental Status: She is alert.  Psychiatric:        Mood and Affect: Mood normal.        Behavior: Behavior normal.     Data Reviewed: Labs reviewed and within normal limits except for mild hyperglycemia Respiratory viral panel reviewed and negative CT scan of the head without contrast showed no evidence of intracranial hemorrhage MRI of the brain without contrast shows several features which can be seen in the setting of idiopathic intracranial hypertension.  CSF opening pressure would best evaluate. Twelve-lead EKG reviewed by me shows sinus rhythm There are no new results to review at this time.  Assessment and Plan: Principal Problem:   TIA (transient ischemic attack) Active Problems:   Essential hypertension   Tobacco abuse   Obesity, Class III, BMI 40-49.9 (morbid obesity) (HCC)   History of CVA (cerebrovascular accident)   TIA In a patient with recent CVA and multiple episodes of TIA in the past Had a CVA ON 11/11/21 and received tPA with improvement in her symptoms. Recently seen by her neurologist who discontinued Plavix We will continue patient on aspirin 81 mg and Plavix Continue high intensity statin Optimize blood pressure control We will request neurology consult PT/OT/ST consult     Nicotine dependence Smoking cessation has been discussed with patient in detail We will place patient on a nicotine transdermal patch 7 mg daily    Morbid obesity (BMI 40) Complicates overall prognosis and  care  Advance Care Planning:   Code Status: Full Code   Consults: Neurology  Family Communication: Greater than 50% of time was spent discussing patient's condition and plan of care with her at the bedside.  All questions and concerns have been addressed.  She verbalizes understanding and agrees with the plan.  Severity of Illness: The appropriate patient status for this patient is OBSERVATION. Observation status is judged to be reasonable and necessary in order to provide the required intensity of service to ensure the patient's safety. The patient's presenting symptoms, physical exam findings, and initial radiographic and laboratory data in the context of their medical condition is felt to place them at decreased risk for further clinical deterioration. Furthermore, it is anticipated that the patient will be medically stable for discharge  from the hospital within 2 midnights of admission.   Author: Lucile Shutters, MD 11/24/2021 8:26 AM  For on call review [email or web address].

## 2021-11-24 NOTE — Consult Note (Signed)
[facility] TeleNeurology Consult Services   Patient Name:   Kayla [name], Kayla [name] Date of Birth:   1981/01/07 Identification Number:   MRN - [id number] Date of Service:   11/24/2021 04:22:23  Diagnosis:       I63.9 - Cerebrovascular accident (CVA), unspecified mechanism ([place])  Impression:      36F presents with recurrent episode of aphasia and R weakness which has improved. Vasculitis seems less likely but rec ESR/CRP, EEG (to check for possibility of sz disorder) and repeat MRI brain wwo. If evidence of a recurrent CVA rec TEE. Rec restarting plavix that was just dced as well.  Our recommendations are outlined below.  Recommendations:        Stroke/Telemetry Floor       Neuro Checks       Bedside Swallow Eval       DVT Prophylaxis       IV Fluids, Normal Saline       Head of Bed 30 Degrees       Euglycemia and Avoid Hyperthermia (PRN Acetaminophen)       Initiate dual antiplatelet therapy with Aspirin 81 mg daily and Clopidogrel 75 mg daily.       Antihypertensives PRN if Blood pressure is greater than 220/120 or there is a concern for End organ damage/contraindications for permissive HTN. If blood pressure is greater than 220/120 give labetalol PO or IV or Vasotec IV with a goal of 15% reduction in BP during the first 24 hours.       EEG, rec TEE if evidence of recurrent CVA, ESR, CRP   Sign Out:       Discussed with Emergency Department Provider    ------------------------------------------------------------------------------  Advanced Imaging: Advanced Imaging Not Completed because:  NIH low (no newly debilitating symptoms), CTA can be done routinely   Metrics: Last Known Well: 11/24/2021 03:55:00 [facility] Notification Time: 11/24/2021 49:67:59 Arrival Time: 11/24/2021 04:04:00 Stamp Time: 11/24/2021 04:22:23 Initial Response Time: 11/24/2021 04:27:37 Symptoms: aphasia, dizziness, nausea, and R face weakness. NIHSS Start Assessment Time:  11/24/2021 04:29:09 Patient is not a candidate for Thrombolytic. Thrombolytic Medical Decision: 11/24/2021 04:31:00 Patient was not deemed candidate for Thrombolytic because of following reasons: No disabling symptoms.  I personally Reviewed the CT Head and it Showed no acute pathology  ED Physician notified of diagnostic impression and management plan on 11/24/2021 04:55:26    ------------------------------------------------------------------------------  History of Present Illness: Patient is a 41 year old Female.  Patient was brought by private transportation with symptoms of aphasia, dizziness, nausea, and R face weakness. 36F 3:55pm was at work and had , Had a CVA 1 month ago (residual word finding difficulty, blurry vision) and got tpa on Jan 24th. Hx TIAs Jan 7 and 18th. On ASA and they had dced plavix a few days ago. She was feeling nauseous before this happened. NO fever or chills.   Past Medical History:      Stroke      There is no history of Seizures  Medications:  No Anticoagulant use  Antiplatelet use: Yes ASA 81 mg Reviewed EMR for current medications  Allergies:  Reviewed  Social History: Smoking: Yes Alcohol Use: Yes Drug Use: No  Family History:  There is no family history of premature cerebrovascular disease pertinent to this consultation  ROS : 14 Points Review of Systems was performed and was negative except mentioned in HPI.  Past Surgical History: There Is No Surgical History Contributory To Todays Visit    Examination: BP(134/112), Pulse(90), Blood  Glucose(76) 1A: Level of Consciousness - Alert; keenly responsive + 0 1B: Ask Month and Age - Both Questions Right + 0 1C: Blink Eyes & Squeeze Hands - Performs Both Tasks + 0 2: Test Horizontal Extraocular Movements - Normal + 0 3: Test Visual Fields - No Visual Loss + 0 4: Test Facial Palsy (Use Grimace if Obtunded) - Partial paralysis (lower face) + 2 5A: Test Left Arm Motor Drift - No  Drift for 10 Seconds + 0 5B: Test Right Arm Motor Drift - No Drift for 10 Seconds + 0 6A: Test Left Leg Motor Drift - No Drift for 5 Seconds + 0 6B: Test Right Leg Motor Drift - No Drift for 5 Seconds + 0 7: Test Limb Ataxia (FNF/Heel-Shin) - No Ataxia + 0 8: Test Sensation - Normal; No sensory loss + 0 9: Test Language/Aphasia - Normal; No aphasia + 0 10: Test Dysarthria - Normal + 0 11: Test Extinction/Inattention - No abnormality + 0  NIHSS Score: 2   Pre-Morbid Modified Rankin Scale: 1 Points = No significant disability despite symptoms; able to carry out all usual duties and activities   Patient/Family was informed the Neurology Consult would occur via TeleHealth consult by way of interactive audio and video telecommunications and consented to receiving care in this manner.   Patient is being evaluated for possible acute neurologic impairment and high probability of imminent or life-threatening deterioration. I spent total of 36 minutes providing care to this patient, including time for face to face visit via telemedicine, review of medical records, imaging studies and discussion of findings with providers, the patient and/or family.   Dr Deitra Mayo   [facility] [phone]   Case [id number]

## 2021-11-24 NOTE — ED Notes (Signed)
IN CT  

## 2021-11-24 NOTE — Evaluation (Signed)
Speech Language Pathology Evaluation Patient Details Name: Kayla [name] MRN: [id number] DOB: 1981-10-18 Today's Date: 11/24/2021 Time: 0940-1000 SLP Time Calculation (min) (ACUTE ONLY): 20 min  Problem List:  Patient Active Problem List   Diagnosis Date Noted   History of CVA (cerebrovascular accident) 11/24/2021   Obesity, Class III, BMI 40-49.9 (morbid obesity) ([place])    Facial paresthesia 10/25/2021   Essential hypertension 10/25/2021   Tobacco abuse 10/25/2021   Hypotension    TIA (transient ischemic attack)    Facial weakness    Empty sella ([place])    Past Medical History: History reviewed. No pertinent past medical history. Past Surgical History:  Past Surgical History:  Procedure Laterality Date   APPENDECTOMY     CHOLECYSTECTOMY     COLONOSCOPY     DILATION AND CURETTAGE OF UTERUS     ESOPHAGOGASTRODUODENOSCOPY     KNEE SURGERY     TUBAL LIGATION     uterine ablation     HPI:  Per [id number] H&P "Kayla [name] is a 41 y.o. female who has a past medical history of hypertension and tobacco abuse along with history of strokelike symptoms-now 3 times in the past couple of months, for 1 of which she also received IV thrombolysis/TNKase at [place] with negative MRIs, presenting to the emergency department this morning with strokelike symptoms.  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  On examination it was noted that she has a right-sided facial droop-she has an asymmetric face at baseline with right angle of the mouth lower than the left which the patient was then was also verified by looking at her driver's license.  She was seen by telemedicine neurology and due to low NIH stroke scale, she was not given IV thrombolysis but was admitted for further stroke work-up.  She has had she had COVID in December after which she started getting headaches off and  on.  I asked her specifically if she has had any headaches which she says she has after COVID.  She describes these headaches as intermittent, sometimes feeling like a sensation of stabbing, no visual aura, no photophobia or phonophobia, no transient visual obscurations, cannot tell me any relieving or aggravating factors but does take an ibuprofen or Tylenol once in a while which relieves the headache.  At their worst, the headaches are 10 out of 10.  At this time, the headache was very mild.  She was seen in early January with strokelike symptoms with a negative MRI.  She was also seen in the middle of January at [facility] where she was given tPA/TNK for concern for stroke, and ensuing imaging negative for stroke.  She has had a detailed stroke in young hypercoagulable panel done at [facility] which is available in care everywhere-which was negative.    Has a history of hypertension.  Blood pressures have been all over the place according to the patient.  Continues to smoke and is trying to quit.  Denies any illicit drug use..."   Assessment / Plan / Recommendation Clinical Impression  Pt seen for cognitive-linguistic evaluation. Assessment completed via informal means and portions of Cognistat.   Pt demonstrated intact basic cognitive-linguistic functioning. Pt scored WFL on all subtests of Cognistat administered. Noted with with delayed processing and paucity of speech observed during assessment and informal conversation. Pt with reports of "brain fog" and reduced wordfinding  for low frequency words since stroke. Pt endorses pt is not a cognitive-lingusitic baseline and that she would like outpatient SLP services "to get back to normal."   Given results of today's assessment, recommend outpatient SLP services and supervision with iADLs at d/c.   Pt, pt's daughter, RN, and CM updated re: results of assessment and d/c recommendations. Discussed basic compensatory strategies for changes to  cognitive-linguistic functioning with pt. Pt verbalized understanding/agreement with education provided.    SLP Assessment  SLP Recommendation/Assessment: Patient needs continued [facility] SLP Visit Diagnosis: Cognitive communication deficit (R41.841)    Recommendations for follow up therapy are one component of a multi-disciplinary discharge planning process, led by the attending physician.  Recommendations may be updated based on patient status, additional functional criteria and insurance authorization.    Follow Up Recommendations  Outpatient SLP    Assistance Recommended at Discharge  PRN  Functional Status Assessment Patient has had a recent decline in their functional status and demonstrates the ability to make significant improvements in function in a reasonable and predictable amount of time.        SLP Evaluation Cognition  Overall Cognitive Status: Impaired/Different from baseline Arousal/Alertness: Awake/alert Orientation Level: Oriented X4 Memory: Appears intact Awareness: Appears intact Problem Solving: Appears intact (delayed processing) Safety/Judgment: Appears intact       Comprehension  Auditory Comprehension Overall Auditory Comprehension: Appears within functional limits for tasks assessed Yes/No Questions: Within Functional Limits Commands: Within Functional Limits Reading Comprehension Reading Status:  (DNT)    Expression Expression Primary Mode of Expression: Verbal Verbal Expression Overall Verbal Expression: Appears within functional limits for tasks assessed Initiation: No impairment Repetition: No impairment Naming: No impairment (not noted on assessment) Pragmatics: No impairment Other Verbal Expression Comments: paucity of speech appreciated Written Expression Dominant Hand: Right Written Expression: Not tested   Oral / Motor  Oral Motor/Sensory Function Overall Oral Motor/Sensory Function: Mild impairment Facial  ROM: Reduced right Motor Speech Overall Motor Speech: Appears within functional limits for tasks assessed Respiration: Within functional limits Resonance: Within functional limits Articulation: Within functional limitis Intelligibility: Intelligible Motor Planning: Witnin functional limits           Cherrie Gauze, M.S., [facility] [phone] (ASCOM)   Quintella Baton 11/24/2021, 12:05 PM

## 2021-11-24 NOTE — ED Triage Notes (Signed)
Pt presents to ER from work.  Pt states appx 0355 she began feeling weird.  Pt has noticeable right sided facial droop and per co-worker, was having some aphasia.  Pt with no unilateral weakness noted.  Denies HA, blurry vision, dizziness.  A&O x4 at this time.  Pt with hx of previous stroke within last month and taken off plavix a few days ago.

## 2021-11-24 NOTE — ED Notes (Signed)
Pt in route to CT

## 2021-11-25 ENCOUNTER — Observation Stay
Admit: 2021-11-25 | Discharge: 2021-11-25 | Disposition: A | Discharge status: Home / Self Care | Payer: BLUE CROSS/BLUE SHIELD

## 2021-11-25 ENCOUNTER — Ambulatory Visit: Payer: Self-pay | Admitting: Cardiology

## 2021-11-25 ENCOUNTER — Encounter
Admission: EM | Disposition: A | Discharge status: Rehabilitation Facility | Payer: Self-pay | Source: Home / Self Care | Attending: Student in an Organized Health Care Education/Training Program

## 2021-11-25 DIAGNOSIS — G459 Transient cerebral ischemic attack, unspecified: Secondary | ICD-10-CM

## 2021-11-25 HISTORY — PX: TEE WITHOUT CARDIOVERSION: SHX5443

## 2021-11-25 LAB — LIPID PANEL
Cholesterol: 118 mg/dL (ref 0–200)
HDL: 36 mg/dL — ABNORMAL LOW (ref >40)
LDL Cholesterol: 62 mg/dL (ref 0–99)
Total CHOL/HDL Ratio: 3.3 RATIO
Triglycerides: 98 mg/dL (ref <150)
VLDL: 20 mg/dL (ref 0–40)

## 2021-11-25 LAB — HEMOGLOBIN A1C
Hgb A1c MFr Bld: 5.1 % (ref 4.8–5.6)
Mean Plasma Glucose: 99.67 mg/dL

## 2021-11-25 SURGERY — ECHOCARDIOGRAM, TRANSESOPHAGEAL
Anesthesia: Moderate Sedation

## 2021-11-25 MED ORDER — SODIUM CHLORIDE FLUSH 0.9 % IV SOLN
INTRAVENOUS | Status: AC
  Filled 2021-11-25: qty 10

## 2021-11-25 MED ORDER — BUTALBITAL-APAP-CAFFEINE 50-325-40 MG PO TABS
2.0000 | ORAL_TABLET | Freq: Once | ORAL | Status: AC
  Administered 2021-11-25: 2 via ORAL
  Filled 2021-11-25: qty 2

## 2021-11-25 MED ORDER — LIDOCAINE VISCOUS HCL 2 % MT SOLN
OROMUCOSAL | Status: AC
  Filled 2021-11-25: qty 15

## 2021-11-25 MED ORDER — BUTAMBEN-TETRACAINE-BENZOCAINE 2-2-14 % EX AERO
INHALATION_SPRAY | CUTANEOUS | Status: AC
  Filled 2021-11-25: qty 5

## 2021-11-25 MED ORDER — MIDAZOLAM HCL 2 MG/2ML IJ SOLN
INTRAMUSCULAR | Status: AC | PRN
  Administered 2021-11-25 (×2): 1 mg via INTRAVENOUS

## 2021-11-25 MED ORDER — NICOTINE 7 MG/24HR TD PT24: 7.0000 mg | MEDICATED_PATCH | Freq: Every day | TRANSDERMAL | 0 refills | Status: DC

## 2021-11-25 MED ORDER — SODIUM CHLORIDE 0.9 % IV SOLN: INTRAVENOUS | Status: DC

## 2021-11-25 MED ORDER — FENTANYL CITRATE (PF) 100 MCG/2ML IJ SOLN
INTRAMUSCULAR | Status: AC
  Filled 2021-11-25: qty 2

## 2021-11-25 MED ORDER — MIDAZOLAM HCL 2 MG/2ML IJ SOLN
INTRAMUSCULAR | Status: AC
  Filled 2021-11-25: qty 4

## 2021-11-25 MED ORDER — FENTANYL CITRATE (PF) 100 MCG/2ML IJ SOLN
INTRAMUSCULAR | Status: AC | PRN
  Administered 2021-11-25: 25 ug via INTRAVENOUS
  Administered 2021-11-25: 50 ug via INTRAVENOUS

## 2021-11-25 MED ORDER — CLOPIDOGREL BISULFATE 75 MG PO TABS: 75.0000 mg | ORAL_TABLET | Freq: Every day | ORAL | 0 refills | Status: AC

## 2021-11-25 NOTE — Progress Notes (Signed)
*  PRELIMINARY RESULTS* Echocardiogram Echocardiogram Transesophageal has been performed.  Cristela Blue 11/25/2021, 12:31 PM

## 2021-11-25 NOTE — Progress Notes (Signed)
Physical Therapy Treatment Patient Details Name: Kayla [name] MRN: [id number] DOB: 10/05/1981 Today's Date: 11/25/2021   History of Present Illness Pt is a 41 y/o F who presented to the ED with c/o recurrent aphasia & R weakness. MRI was negative for acute intracranial abnormality. PMH: multiple recent TIAs, recent admission to [facility] hospital for CVA (pt received tPA)    PT Comments    Patient in bed, agreeable to PT. Pt denied any symptoms at rest currently, but did state they happened today, spontaneously, and then lasted for a little while. Bed mobility and transfers all performed independently. She was able to ambulate ~139ft with no AD, supervision. The patient did exhibit intermittent gait path deviations, staggering, and 1-2 small LOB but able to correct without assistance. Pt educated on focusing on an object for gaze stabilization with some improvement in patient gait.   The patient reported symptoms with visual tracking, no abnormalities noted or end range nystagmus. Stated that it almost "hurt", and was hard to do. R and L dixhallpike assessed; pt with increased "whooziness" on the R compared to the L, pt noted "pressure for L sided testing, no nystagmus noted. R Epley performed, pt with complaints at end of movement once up in sitting, that she felt "drunk". Pt seemed to have motion sensitivity, and complained of aural fullness bilaterally as well. Overall the patient would benefit from further skilled PT intervention to maximize function, balance, and gait. Recommendation is outpatient vestibular therapy.   Of noted, with full R and L cervical rotation, pt reported increased pressure in her head, that did not resolve until she returned to midline.     Recommendations for follow up therapy are one component of a multi-disciplinary discharge planning process, led by the attending physician.  Recommendations may be updated based on patient status, additional functional criteria and  insurance authorization.  Follow Up Recommendations  Outpatient PT (vestibular outpatient)     Assistance Recommended at Discharge PRN  Patient can return home with the following A little help with walking and/or transfers;A little help with bathing/dressing/bathroom;Assistance with cooking/housework;Direct supervision/assist for financial management;Assist for transportation   Equipment Recommendations  None recommended by PT    Recommendations for Other Services       Precautions / Restrictions Precautions Precautions: None Restrictions Weight Bearing Restrictions: No     Mobility  Bed Mobility Overal bed mobility: Modified Independent             General bed mobility comments: HOB elevated    Transfers Overall transfer level: Independent Equipment used: None Transfers: Sit to/from Stand Sit to Stand: Independent                Ambulation/Gait Ambulation/Gait assistance: Min guard, Supervision Gait Distance (Feet): 160 Feet Assistive device: None         General Gait Details: some staggering noted, gait path deviations 1-2 small LOB but pt able to self correct. cued for targeting an object to stare at for gaze stabilization   Stairs             Wheelchair Mobility    Modified Rankin (Stroke Patients Only)       Balance Overall balance assessment: Independent Sitting-balance support: Feet supported Sitting balance-Leahy Scale: Normal     Standing balance support: No upper extremity supported, During functional activity Standing balance-Leahy Scale: Good  Cognition Arousal/Alertness: Awake/alert Behavior During Therapy: WFL for tasks assessed/performed Overall Cognitive Status: Within Functional Limits for tasks assessed                                          Exercises Other Exercises Other Exercises: orthostatic vitals assessed, WNLs Other Exercises: R and L dix  hallpike, R epley    General Comments        Pertinent Vitals/Pain Pain Assessment Pain Assessment: No/denies pain    Home Living                          Prior Function            PT Goals (current goals can now be found in the care plan section) Progress towards PT goals: Progressing toward goals    Frequency    7X/week      PT Plan Current plan remains appropriate    Co-evaluation              AM-PAC PT "6 Clicks" Mobility   Outcome Measure  Help needed turning from your back to your side while in a flat bed without using bedrails?: None Help needed moving from lying on your back to sitting on the side of a flat bed without using bedrails?: None Help needed moving to and from a bed to a chair (including a wheelchair)?: None Help needed standing up from a chair using your arms (e.g., wheelchair or bedside chair)?: None Help needed to walk in hospital room?: None Help needed climbing 3-5 steps with a railing? : A Little 6 Click Score: 23    End of Session Equipment Utilized During Treatment: Gait belt Activity Tolerance: Patient tolerated treatment well Patient left: in bed;with call bell/phone within reach Nurse Communication: Other (comment) (orthostatic vitals) PT Visit Diagnosis: Unsteadiness on feet (R26.81);Dizziness and giddiness (R42)     Time: 9381-0175 PT Time Calculation (min) (ACUTE ONLY): 32 min  Charges:  $Therapeutic Activity: 8-22 mins $Canalith Rep Proc: 8-22 mins                     Olga Coaster PT, DPT 11:40 AM,11/25/21

## 2021-11-25 NOTE — Progress Notes (Signed)
Neurology Progress Note   S:// Seen and examined.  No acute changes.  Mild headache.  No visual symptoms.  No transient visual obscurations.  No positional variation of the headache   O:// Current vital signs: BP 118/73 (BP Location: Right Arm)    Pulse 70    Temp 98 F (36.7 C) (Oral)    Resp 16    Ht 5\' 9"  (1.753 m)    Wt 123.4 kg    SpO2 100%    BMI 40.17 kg/m  Vital signs in last 24 hours: Temp:  [98 F (36.7 C)-99 F (37.2 C)] 98 F (36.7 C) (02/07 0734) Pulse Rate:  [70-77] 70 (02/07 0734) Resp:  [16-20] 16 (02/07 0734) BP: (96-125)/(62-80) 118/73 (02/07 0734) SpO2:  [97 %-100 %] 100 % (02/07 0734)  GENERAL: Awake, alert in NAD HEENT: - Normocephalic and atraumatic, dry mm, no LN++, no Thyromegally LUNGS - Clear to auscultation bilaterally with no wheezes CV - S1S2 RRR, no m/r/g, equal pulses bilaterally. ABDOMEN - Soft, nontender, nondistended with normoactive BS Ext: warm, well perfused, intact peripheral pulses, both legs have nonpitting edema   NEURO:  Mental Status: AA&Ox3  Language: speech is clear with no dysarthria.  Naming, repetition, fluency, and comprehension intact. Cranial Nerves: PERRL. EOMI, visual fields full, addressed, face looks mildly asymmetric with the right angle of the mild drooping down-patient says this is normal for her, on smiling, there is no asymmetry, nasolabial folds also have a mild asymmetry with the right nasolabial fold slightly flat appearing-this is baseline per the patient, facial sensation intact, hearing intact, tongue/uvula/soft palate midline, normal sternocleidomastoid and trapezius muscle strength. No evidence of tongue atrophy or fibrillations Motor: 5/5 in all fours, no drift Tone: is normal and bulk is normal Sensation- Intact to light touch bilaterally, no extinction Coordination: FTN intact bilaterally, no ataxia in BLE. Gait- deferred   NIHSS--0  Normal exam with no change since yesterday  Medications  Current  Facility-Administered Medications:    acetaminophen (TYLENOL) tablet 1,000 mg, 1,000 mg, Oral, Q8H PRN, Agbata, Tochukwu, MD, 1,000 mg at 11/25/21 0851   albuterol (PROVENTIL) (2.5 MG/3ML) 0.083% nebulizer solution 3 mL, 3 mL, Inhalation, Q6H PRN, Agbata, Tochukwu, MD   amLODipine (NORVASC) tablet 5 mg, 5 mg, Oral, Daily, Agbata, Tochukwu, MD, 5 mg at 11/25/21 0847   aspirin EC tablet 81 mg, 81 mg, Oral, Daily, Agbata, Tochukwu, MD, 81 mg at 11/25/21 0848   atorvastatin (LIPITOR) tablet 80 mg, 80 mg, Oral, QPM, Agbata, Tochukwu, MD, 80 mg at 11/24/21 1931   enoxaparin (LOVENOX) injection 40 mg, 40 mg, Subcutaneous, Q24H, Agbata, Tochukwu, MD, 40 mg at 11/24/21 1027   metoprolol tartrate (LOPRESSOR) tablet 25 mg, 25 mg, Oral, Daily, Agbata, Tochukwu, MD, 25 mg at 11/25/21 0848   nicotine (NICODERM CQ - dosed in mg/24 hr) patch 7 mg, 7 mg, Transdermal, Daily, Agbata, Tochukwu, MD, 7 mg at 11/25/21 0903 Labs CBC    Component Value Date/Time   WBC 8.6 11/24/2021 0621   RBC 3.97 11/24/2021 0621   HGB 12.1 11/24/2021 0621   HCT 36.7 11/24/2021 0621   PLT 325 11/24/2021 0621   MCV 92.4 11/24/2021 0621   MCH 30.5 11/24/2021 0621   MCHC 33.0 11/24/2021 0621   RDW 13.0 11/24/2021 0621   LYMPHSABS 3.2 11/24/2021 0411   MONOABS 0.7 11/24/2021 0411   EOSABS 0.2 11/24/2021 0411   BASOSABS 0.1 11/24/2021 0411    CMP     Component Value Date/Time   NA 138  11/24/2021 0411   K 3.6 11/24/2021 0411   CL 109 11/24/2021 0411   CO2 22 11/24/2021 0411   GLUCOSE 116 (H) 11/24/2021 0411   BUN 18 11/24/2021 0411   CREATININE 0.80 11/24/2021 0411   CALCIUM 9.2 11/24/2021 0411   PROT 7.4 11/24/2021 0411   ALBUMIN 4.0 11/24/2021 0411   AST 34 11/24/2021 0411   ALT 30 11/24/2021 0411   ALKPHOS 64 11/24/2021 0411   BILITOT 0.5 11/24/2021 0411   GFRNONAA >60 11/24/2021 0411    glycosylated hemoglobin-5.1  Lipid Panel     Component Value Date/Time   CHOL 118 11/25/2021 0449   TRIG 98 11/25/2021  0449   HDL 36 (L) 11/25/2021 0449   CHOLHDL 3.3 11/25/2021 0449   VLDL 20 11/25/2021 0449   LDLCALC 62 11/25/2021 0449   Transesophageal echocardiogram with no evidence of intracardiac thrombus.  There was mention of slow flow in the right ventricle in the left atrium which can sometimes be related with thromboembolic phenomenon but no definitive clot seen.    Lower extremity Dopplers-negative for DVT  Imaging I have reviewed images in epic and the results pertinent to this consultation are: No new brain imaging  Assessment: 41 year old with multiple strokelike episodes, for 1 of these also received IV thrombolysis.  Imaging in the form of MRI has been negative for strokes.  Concern for recurrent TIAs. History somewhat complicated with some headaches associated but not classic for migraine or idiopathic intracranial hypertension (considered because of empty sella finding on MRI) Hypercoagulable work-up negative TEE done today-unremarkable for intracardiac thrombus but some concern for smoke in the right ventricle which might indicate slow flow and has sometimes been reported to be associated with increased time embolic events although no definitive clot seen. Given the absence of an overwhelming indication for anticoagulation, we would continue her on dual antiplatelets for now and follow-up with outpatient neurology and cardiology after the 30-day heart monitor.  Impression: Recurrent TIAs Headache  Recommendations: -30-day cardiac monitor to evaluate for atrial fibrillation. -Outpatient follow-up with neurology in 4 to 6 weeks -Outpatient cardiology follow-up with consideration for repeat 2D echocardiogram in the next few months to get a more understanding of any abnormality that might exist given the somewhat equivocal TEE findings. -Absolutely no indication for anticoagulation at this time given no atrial fibrillation or intracardiac thrombus. -Continue aspirin 81 and Plavix 75 for 3  more weeks followed by aspirin only. -On atorvastatin 40 at home-continue. -For the incidental finding of the empty sella, which can sometimes be associated with idiopathic intracranial hypertension, she does not describe anything in her history that makes me think that she has the IIHT type of headache.  I would defer her headache evaluation and management to the outpatient neurology appointment.  Plan discussed with the patient, and relayed to primary hospitalist via secure chat.   -- Amie Portland, MD Neurologist [facility] Pager: [phone]

## 2021-11-25 NOTE — Progress Notes (Signed)
Occupational Therapy * Physical Therapy * Speech Therapy          DATE ___07 February 2023________________ PATIENT NAME_____[name]________________ PATIENT MRN___[id number]_________________  DIAGNOSIS/DIAGNOSIS CODE ___ TIA (transient ischemic attack) G45.9  ___________________ DATE OF DISCHARGE: ______________  PRIMARY CARE PHYSICIAN ____ Zada Finders, PA-C     General - Physician Assistant    [phone]  ______________________ PCP PHONE/FAX___________________________     Dear Provider (Name: __________________   Fax: ___________________________):   I certify that I have examined this patient and that occupational/physical/speech therapy is necessary on an outpatient basis.    The patient has expressed interest in completing their recommended course of therapy at your location.  Once a formal order from the patient's primary care physician has been obtained, please contact him/her to schedule an appointment for evaluation at your earliest convenience.   [ x ]  Physical Therapy Evaluate and Treat          [  x]  Occupational Therapy Evaluate and Treat                                    [  ]  Speech Therapy Evaluate and Treat       The patient's primary care physician (listed above) must furnish and be responsible for a formal order such that the recommended services may be furnished while under the primary physician's care, and that the plan of care will be established and reviewed every 30 days (or more often if condition necessitates).

## 2021-11-25 NOTE — Hospital Course (Signed)
Kayla Kayla [name] is Kayla [name] 41 y.o. female  [id number]  Primary Cardiologist: Kayla Kayla [name] Reason for Consultation: TIA  HPI: This is Kayla [name] 41 year old white female with history of hypertension who presented to the hospital with TIA and I was asked to evaluate the patient for possible left atrial or left ventricle thrombi.  I did Kayla [name] transesophageal echocardiogram just now and results will be posted separately.   Review of Systems: No weakness anywhere   History reviewed. No pertinent past medical history.  Medications Prior to Admission  Medication Sig Dispense Refill   amLODipine (NORVASC) 5 MG tablet Take 5 mg by mouth daily.     aspirin EC 81 MG tablet Take 1 tablet (81 mg total) by mouth daily. Swallow whole. 30 tablet 1   atorvastatin (LIPITOR) 40 MG tablet Take 40 mg by mouth daily.     acetaminophen (TYLENOL) 500 MG tablet Take 1,000 mg by mouth every 8 (eight) hours as needed for mild pain.     albuterol (VENTOLIN HFA) 108 (90 Base) MCG/ACT inhaler Inhale 2 puffs into the lungs every 6 (six) hours as needed for wheezing or shortness of breath.     atorvastatin (LIPITOR) 80 MG tablet Take 1 tablet (80 mg total) by mouth daily. (Patient not taking: Reported on 11/24/2021) 30 tablet 1   metoprolol tartrate (LOPRESSOR) 25 MG tablet Take 25 mg by mouth daily.        [MAR Hold] amLODipine  5 mg Oral Daily   [MAR Hold] aspirin EC  81 mg Oral Daily   [MAR Hold] atorvastatin  80 mg Oral QPM   butamben-tetracaine-benzocaine       [MAR Hold] enoxaparin (LOVENOX) injection  40 mg Subcutaneous Q24H   fentaNYL       lidocaine       [MAR Hold] metoprolol tartrate  25 mg Oral Daily   midazolam       [MAR Hold] nicotine  7 mg Transdermal Daily   sodium chloride flush        Infusions:  sodium chloride 20 mL/hr at 11/25/21 1140    Allergies  Allergen Reactions   Eszopiclone    Levaquin [Levofloxacin] Other (See Comments)    paranoid   Sulfa Antibiotics Rash    Social History    Socioeconomic History   Marital status: Single    Spouse name: Not on file   Number of children: Not on file   Years of education: Not on file   Highest education level: Not on file  Occupational History   Not on file  Tobacco Use   Smoking status: Some Days    Types: Cigarettes   Smokeless tobacco: Not on file  Substance and Sexual Activity   Alcohol use: Yes   Drug use: Not on file   Sexual activity: Yes    Birth control/protection: Surgical  Other Topics Concern   Not on file  Social History Narrative   Not on file   Social Determinants of Health   Financial Resource Strain: Not on file  Food Insecurity: Not on file  Transportation Needs: Not on file  Physical Activity: Not on file  Stress: Not on file  Social Connections: Not on file  Intimate Partner Violence: Not on file    Family History  Problem Relation Age of Onset   Diabetes Mother    Hypertension Mother     PHYSICAL EXAM: Vitals:   11/25/21 1225 11/25/21 1230  BP: 102/68 104/64  Pulse: 79 65  Resp:  17 18  Temp:    SpO2: 100% 100%     Intake/Output Summary (Last 24 hours) at 11/25/2021 1251 Last data filed at 11/24/2021 1922 Gross per 24 hour  Intake 240 ml  Output --  Net 240 ml    General:  Well appearing. No respiratory difficulty HEENT: normal Neck: supple. no JVD. Carotids 2+ bilat; no bruits. No lymphadenopathy or thryomegaly appreciated. Cor: PMI nondisplaced. Regular rate & rhythm. No rubs, gallops or murmurs. Lungs: clear Abdomen: soft, nontender, nondistended. No hepatosplenomegaly. No bruits or masses. Good bowel sounds. Extremities: no cyanosis, clubbing, rash, edema Neuro: alert & oriented x 3, cranial nerves grossly intact. moves all 4 extremities w/o difficulty. Affect pleasant.  ECG: Normal sinus rhythm  Results for orders placed or performed during the hospital encounter of 11/24/21 (from the past 24 hour(s))  Hemoglobin A1c     Status: None   Collection Time: 11/25/21   4:49 AM  Result Value Ref Range   Hgb A1c MFr Bld 5.1 4.8 - 5.6 %   Mean Plasma Glucose 99.67 mg/dL  Lipid panel     Status: Abnormal   Collection Time: 11/25/21  4:49 AM  Result Value Ref Range   Cholesterol 118 0 - 200 mg/dL   Triglycerides 98 <407 mg/dL   HDL 36 (L) >68 mg/dL   Total CHOL/HDL Ratio 3.3 RATIO   VLDL 20 0 - 40 mg/dL   LDL Cholesterol 62 0 - 99 mg/dL   MR Brain W and Wo Contrast  Result Date: 11/24/2021 CLINICAL DATA:  41 year old female code stroke presentation. Right facial droop and aphasia. EXAM: MRI HEAD WITHOUT AND WITH CONTRAST TECHNIQUE: Multiplanar, multiecho pulse sequences of the brain and surrounding structures were obtained without and with intravenous contrast. CONTRAST:  68mL GADAVIST GADOBUTROL 1 MMOL/ML IV SOLN COMPARISON:  Plain head CT this morning.  Brain MRI, CTA CTP 1 723. FINDINGS: Brain: Partially empty sella appearance once again noted. No restricted diffusion to suggest acute infarction. No midline shift, mass effect, evidence of mass lesion, ventriculomegaly, extra-axial collection or acute intracranial hemorrhage. Cervicomedullary junction is within normal limits. No abnormal enhancement identified. No dural thickening. No chronic cerebral blood products or encephalomalacia identified. Kayla Kayla [name] and white matter signal remains within normal limits throughout the brain. Vascular: Major intracranial vascular flow voids are stable since January. Dominant left vertebral artery as before. The major dural venous sinuses are enhancing and appear to be patent. There is an effaced appearance of the transverse and sigmoid venous sinus junctions. Skull and upper cervical spine: Negative visible cervical spine. Visualized bone marrow signal is within normal limits. Sinuses/Orbits: Stable prominent CSF in the bilateral optic nerve root sleeves. No papilledema is evident. Paranasal sinuses and mastoids are well aerated. Other: Visible internal auditory structures appear  normal. Negative visible scalp and face. IMPRESSION: No acute intracranial abnormality. Stable MRI appearance of the brain since January, remarkable only for several features which can be seen in the setting of idiopathic intracranial hypertension (pseudotumor cerebri). Query Papilledema. CSF opening pressure would best evaluate further. Electronically Signed   By: [name]   On: 11/24/2021 06:22   US Venous Img Lower Bilateral (DVT)  Result Date: 11/24/2021 CLINICAL DATA:  Deep venous thrombosis Pain Varicose veins EXAM: BILATERAL LOWER EXTREMITY VENOUS DOPPLER ULTRASOUND TECHNIQUE: Gray-scale sonography with compression, as well as color and duplex ultrasound, were performed to evaluate the deep venous system(s) from the level of the common femoral vein through the popliteal and proximal calf veins. COMPARISON:  None. FINDINGS: VENOUS Normal compressibility of the common femoral, superficial femoral, and popliteal veins, as well as the visualized calf veins. Visualized portions of profunda femoral vein and great saphenous vein unremarkable. No filling defects to suggest DVT on grayscale or color Doppler imaging. Doppler waveforms show normal direction of venous flow, normal respiratory plasticity and response to augmentation. OTHER None. Limitations: Limited visualization of the left calf veins. IMPRESSION: No lower extremity DVT. Electronically Signed   By: Acquanetta BellingFarhaan  Mir M.D.   On: 11/24/2021 11:41   ECHO TEE  Result Date: 11/25/2021    TRANSESOPHOGEAL ECHO REPORT   Patient Name:   Kayla Kayla [name] Date of Exam: 11/25/2021 Medical Rec #:  [id number]    Height:       69.0 in Accession #:    [id number]   Weight:       272.0 lb Date of Birth:  1981/06/28   BSA:          2.354 m Patient Age:    40 years     BP:           102/68 mmHg Patient Gender: F            HR:           79 bpm. Exam Location:  [facility] Procedure: Transesophageal Echo, Cardiac Doppler, Color Doppler and Saline            Contrast Bubble Study  Indications:     Not listed on TEE check-in sheet.  History:         Patient has prior history of Echocardiogram examinations, most                  recent 10/25/2021. No past medical history on file.  Sonographer:     Cristela BlueJerry Hege Referring Phys:  [id number] AMBER SCOGGINS Diagnosing Phys: Kayla BlackwaterShaukat Ornella Coderre PROCEDURE: The transesophogeal probe was passed without difficulty through the esophogus of the patient. Sedation performed by performing physician. The patient's vital signs; including heart rate, blood pressure, and oxygen saturation; remained stable throughout the procedure. The patient developed no complications during the procedure. IMPRESSIONS  1. Left ventricular ejection fraction, by estimation, is 60 to 65%. The left ventricle has normal function. The left ventricle has no regional wall motion abnormalities.  2. Right ventricular systolic function is normal. The right ventricular size is normal.  3. Spontaneous noted in right ventricle. No left atrial/left atrial appendage thrombus was detected.  4. The mitral valve is normal in structure. Mild mitral valve regurgitation. No evidence of mitral stenosis.  5. The aortic valve is normal in structure. Aortic valve regurgitation is not visualized. No aortic stenosis is present.  6. The inferior vena cava is normal in size with greater than 50% respiratory variability, suggesting right atrial pressure of 3 mmHg.  7. Agitated saline contrast bubble study was negative, with no evidence of any interatrial shunt. Conclusion(s)/Recommendation(s): Normal biventricular function without evidence of hemodynamically significant valvular heart disease. No thrombii seen but there were spontaneus contrast in RV noted. FINDINGS  Left Ventricle: Left ventricular ejection fraction, by estimation, is 60 to 65%. The left ventricle has normal function. The left ventricle has no regional wall motion abnormalities. The left ventricular internal cavity size was normal in size. There is  no  left ventricular hypertrophy. Right Ventricle: The right ventricular size is normal. No increase in right ventricular wall thickness. Right ventricular systolic function is normal. Left Atrium: Spontaneous noted in right ventricle. Left atrial size was normal  in size. Spontaneous echo contrast was present. No left atrial/left atrial appendage thrombus was detected. Right Atrium: Right atrial size was normal in size. Pericardium: There is no evidence of pericardial effusion. Mitral Valve: The mitral valve is normal in structure. Mild mitral valve regurgitation. No evidence of mitral valve stenosis. Tricuspid Valve: The tricuspid valve is normal in structure. Tricuspid valve regurgitation is trivial. No evidence of tricuspid stenosis. Aortic Valve: The aortic valve is normal in structure. Aortic valve regurgitation is not visualized. No aortic stenosis is present. Pulmonic Valve: The pulmonic valve was normal in structure. Pulmonic valve regurgitation is not visualized. No evidence of pulmonic stenosis. Aorta: The aortic root is normal in size and structure. Venous: The inferior vena cava is normal in size with greater than 50% respiratory variability, suggesting right atrial pressure of 3 mmHg. IAS/Shunts: No atrial level shunt detected by color flow Doppler. Agitated saline contrast was given intravenously to evaluate for intracardiac shunting. Agitated saline contrast bubble study was negative, with no evidence of any interatrial shunt. There  is no evidence of Kayla [name] patent foramen ovale. There is no evidence of an atrial septal defect. Kayla Kayla [name] Electronically signed by Kayla Kayla [name] Signature Date/Time: 11/25/2021/12:49:09 PM    Final    CT HEAD CODE STROKE WO CONTRAST  Addendum Date: 11/24/2021   ADDENDUM REPORT: 11/24/2021 04:37 ADDENDUM: Study discussed by telephone with Dr. Roxan Hockey in the ED on 11/24/2021 at 0428 hours. Electronically Signed   By: [name]   On: 11/24/2021 04:37   Result Date:  11/24/2021 CLINICAL DATA:  Code stroke. 41 year old female with neurologic deficit onset 0355 hours. Right facial droop and some aphasia. EXAM: CT HEAD WITHOUT CONTRAST TECHNIQUE: Contiguous axial images were obtained from the base of the skull through the vertex without intravenous contrast. RADIATION DOSE REDUCTION: This exam was performed according to the departmental dose-optimization program which includes automated exposure control, adjustment of the mA and/or kV according to patient size and/or use of iterative reconstruction technique. COMPARISON:  Brain MRI and head CT, CTA head and neck and CT Perfusion 10/25/2021. FINDINGS: Brain: Normal cerebral volume. No midline shift, ventriculomegaly, mass effect, evidence of mass lesion, intracranial hemorrhage or evidence of cortically based acute infarction. Gray-white matter differentiation is within normal limits throughout the brain. Partially empty sella redemonstrated. Vascular: No suspicious intracranial vascular hyperdensity. Skull: No acute osseous abnormality identified. 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 Otherwise Visualized paranasal sinuses and mastoids are clear. Other: Mild leftward gaze. Visualized scalp soft tissues are within normal limits. ASPECTS [facility] Stroke Program Early CT Score) Total score (0-10 with 10 being normal): 10 IMPRESSION: Stable and normal noncontrast CT appearance of the brain. ASPECTS 10. Electronically Signed: By: [name] On: 11/24/2021 04:24     ASSESSMENT AND PLAN: Transesophageal echocardiogram was done without complication which showed no evidence of left atrial or left ventricle or left atrial appendage thrombi.  There was some spontaneous contrast or smoke in the right ventricle which is harbinger of thromboembolic phenomenon but there was no evidence of any thrombi.  There is Kayla [name] strong evidence of TIA and stroke may consider anticoagulation.  However there is note obvious  thrombi that are seen in heart chambers.  Kayla Kayla [name]

## 2021-11-25 NOTE — Consult Note (Signed)
Kayla [name] is a 41 y.o. female  [id number]  Primary Cardiologist: Adrian Blackwater Reason for Consultation: CVA/TIA  HPI: This is a 41 year old white female with history of TIA and I was asked to evaluate the patient for transesophageal echocardiogram.   Review of Systems: No chest pain   History reviewed. No pertinent past medical history.  Medications Prior to Admission  Medication Sig Dispense Refill   amLODipine (NORVASC) 5 MG tablet Take 5 mg by mouth daily.     aspirin EC 81 MG tablet Take 1 tablet (81 mg total) by mouth daily. Swallow whole. 30 tablet 1   atorvastatin (LIPITOR) 40 MG tablet Take 40 mg by mouth daily.     acetaminophen (TYLENOL) 500 MG tablet Take 1,000 mg by mouth every 8 (eight) hours as needed for mild pain.     albuterol (VENTOLIN HFA) 108 (90 Base) MCG/ACT inhaler Inhale 2 puffs into the lungs every 6 (six) hours as needed for wheezing or shortness of breath.     atorvastatin (LIPITOR) 80 MG tablet Take 1 tablet (80 mg total) by mouth daily. (Patient not taking: Reported on 11/24/2021) 30 tablet 1   metoprolol tartrate (LOPRESSOR) 25 MG tablet Take 25 mg by mouth daily.        [MAR Hold] amLODipine  5 mg Oral Daily   [MAR Hold] aspirin EC  81 mg Oral Daily   [MAR Hold] atorvastatin  80 mg Oral QPM   butamben-tetracaine-benzocaine       [MAR Hold] enoxaparin (LOVENOX) injection  40 mg Subcutaneous Q24H   fentaNYL       lidocaine       [MAR Hold] metoprolol tartrate  25 mg Oral Daily   midazolam       [MAR Hold] nicotine  7 mg Transdermal Daily   sodium chloride flush        Infusions:  sodium chloride 20 mL/hr at 11/25/21 1140    Allergies  Allergen Reactions   Eszopiclone    Levaquin [Levofloxacin] Other (See Comments)    paranoid   Sulfa Antibiotics Rash    Social History   Socioeconomic History   Marital status: Single    Spouse name: Not on file   Number of children: Not on file   Years of education: Not on file   Highest  education level: Not on file  Occupational History   Not on file  Tobacco Use   Smoking status: Some Days    Types: Cigarettes   Smokeless tobacco: Not on file  Substance and Sexual Activity   Alcohol use: Yes   Drug use: Not on file   Sexual activity: Yes    Birth control/protection: Surgical  Other Topics Concern   Not on file  Social History Narrative   Not on file   Social Determinants of Health   Financial Resource Strain: Not on file  Food Insecurity: Not on file  Transportation Needs: Not on file  Physical Activity: Not on file  Stress: Not on file  Social Connections: Not on file  Intimate Partner Violence: Not on file    Family History  Problem Relation Age of Onset   Diabetes Mother    Hypertension Mother     PHYSICAL EXAM: Vitals:   11/25/21 1225 11/25/21 1230  BP: 102/68 104/64  Pulse: 79 65  Resp: 17 18  Temp:    SpO2: 100% 100%     Intake/Output Summary (Last 24 hours) at 11/25/2021 1254 Last data filed at  11/24/2021 1922 Gross per 24 hour  Intake 240 ml  Output --  Net 240 ml    General:  Well appearing. No respiratory difficulty HEENT: normal Neck: supple. no JVD. Carotids 2+ bilat; no bruits. No lymphadenopathy or thryomegaly appreciated. Cor: PMI nondisplaced. Regular rate & rhythm. No rubs, gallops or murmurs. Lungs: clear Abdomen: soft, nontender, nondistended. No hepatosplenomegaly. No bruits or masses. Good bowel sounds. Extremities: no cyanosis, clubbing, rash, edema Neuro: alert & oriented x 3, cranial nerves grossly intact. moves all 4 extremities w/o difficulty. Affect pleasant.  ECG: Normal sinus rhythm.  Results for orders placed or performed during the hospital encounter of 11/24/21 (from the past 24 hour(s))  Hemoglobin A1c     Status: None   Collection Time: 11/25/21  4:49 AM  Result Value Ref Range   Hgb A1c MFr Bld 5.1 4.8 - 5.6 %   Mean Plasma Glucose 99.67 mg/dL  Lipid panel     Status: Abnormal   Collection Time:  11/25/21  4:49 AM  Result Value Ref Range   Cholesterol 118 0 - 200 mg/dL   Triglycerides 98 <242 mg/dL   HDL 36 (L) >35 mg/dL   Total CHOL/HDL Ratio 3.3 RATIO   VLDL 20 0 - 40 mg/dL   LDL Cholesterol 62 0 - 99 mg/dL   MR Brain W and Wo Contrast  Result Date: 11/24/2021 CLINICAL DATA:  41 year old female code stroke presentation. Right facial droop and aphasia. EXAM: MRI HEAD WITHOUT AND WITH CONTRAST TECHNIQUE: Multiplanar, multiecho pulse sequences of the brain and surrounding structures were obtained without and with intravenous contrast. CONTRAST:  55mL GADAVIST GADOBUTROL 1 MMOL/ML IV SOLN COMPARISON:  Plain head CT this morning.  Brain MRI, CTA CTP 1 723. FINDINGS: Brain: Partially empty sella appearance once again noted. No restricted diffusion to suggest acute infarction. No midline shift, mass effect, evidence of mass lesion, ventriculomegaly, extra-axial collection or acute intracranial hemorrhage. Cervicomedullary junction is within normal limits. No abnormal enhancement identified. No dural thickening. No chronic cerebral blood products or encephalomalacia identified. Wallace Cullens and white matter signal remains within normal limits throughout the brain. Vascular: Major intracranial vascular flow voids are stable since January. Dominant left vertebral artery as before. The major dural venous sinuses are enhancing and appear to be patent. There is an effaced appearance of the transverse and sigmoid venous sinus junctions. Skull and upper cervical spine: Negative visible cervical spine. Visualized bone marrow signal is within normal limits. Sinuses/Orbits: Stable prominent CSF in the bilateral optic nerve root sleeves. No papilledema is evident. Paranasal sinuses and mastoids are well aerated. Other: Visible internal auditory structures appear normal. Negative visible scalp and face. IMPRESSION: No acute intracranial abnormality. Stable MRI appearance of the brain since January, remarkable only for  several features which can be seen in the setting of idiopathic intracranial hypertension (pseudotumor cerebri). Query Papilledema. CSF opening pressure would best evaluate further. Electronically Signed   By: [name]   On: 11/24/2021 06:22   US Venous Img Lower Bilateral (DVT)  Result Date: 11/24/2021 CLINICAL DATA:  Deep venous thrombosis Pain Varicose veins EXAM: BILATERAL LOWER EXTREMITY VENOUS DOPPLER ULTRASOUND TECHNIQUE: Gray-scale sonography with compression, as well as color and duplex ultrasound, were performed to evaluate the deep venous system(s) from the level of the common femoral vein through the popliteal and proximal calf veins. COMPARISON:  None. FINDINGS: VENOUS Normal compressibility of the common femoral, superficial femoral, and popliteal veins, as well as the visualized calf veins. Visualized portions of profunda  femoral vein and great saphenous vein unremarkable. No filling defects to suggest DVT on grayscale or color Doppler imaging. Doppler waveforms show normal direction of venous flow, normal respiratory plasticity and response to augmentation. OTHER None. Limitations: Limited visualization of the left calf veins. IMPRESSION: No lower extremity DVT. Electronically Signed   By: Acquanetta Belling M.D.   On: 11/24/2021 11:41   ECHO TEE  Result Date: 11/25/2021    TRANSESOPHOGEAL ECHO REPORT   Patient Name:   Kayla [name] Date of Exam: 11/25/2021 Medical Rec #:  [id number]    Height:       69.0 in Accession #:    [id number]   Weight:       272.0 lb Date of Birth:  1981/04/27   BSA:          2.354 m Patient Age:    40 years     BP:           102/68 mmHg Patient Gender: F            HR:           79 bpm. Exam Location:  [facility] Procedure: Transesophageal Echo, Cardiac Doppler, Color Doppler and Saline            Contrast Bubble Study Indications:     Not listed on TEE check-in sheet.  History:         Patient has prior history of Echocardiogram examinations, most                  recent  10/25/2021. No past medical history on file.  Sonographer:     Cristela Blue Referring Phys:  [id number] AMBER SCOGGINS Diagnosing Phys: Adrian Blackwater PROCEDURE: The transesophogeal probe was passed without difficulty through the esophogus of the patient. Sedation performed by performing physician. The patient's vital signs; including heart rate, blood pressure, and oxygen saturation; remained stable throughout the procedure. The patient developed no complications during the procedure. IMPRESSIONS  1. Left ventricular ejection fraction, by estimation, is 60 to 65%. The left ventricle has normal function. The left ventricle has no regional wall motion abnormalities.  2. Right ventricular systolic function is normal. The right ventricular size is normal.  3. Spontaneous noted in right ventricle. No left atrial/left atrial appendage thrombus was detected.  4. The mitral valve is normal in structure. Mild mitral valve regurgitation. No evidence of mitral stenosis.  5. The aortic valve is normal in structure. Aortic valve regurgitation is not visualized. No aortic stenosis is present.  6. The inferior vena cava is normal in size with greater than 50% respiratory variability, suggesting right atrial pressure of 3 mmHg.  7. Agitated saline contrast bubble study was negative, with no evidence of any interatrial shunt. Conclusion(s)/Recommendation(s): Normal biventricular function without evidence of hemodynamically significant valvular heart disease. No thrombii seen but there were spontaneus contrast in RV noted. FINDINGS  Left Ventricle: Left ventricular ejection fraction, by estimation, is 60 to 65%. The left ventricle has normal function. The left ventricle has no regional wall motion abnormalities. The left ventricular internal cavity size was normal in size. There is  no left ventricular hypertrophy. Right Ventricle: The right ventricular size is normal. No increase in right ventricular wall thickness. Right ventricular  systolic function is normal. Left Atrium: Spontaneous noted in right ventricle. Left atrial size was normal in size. Spontaneous echo contrast was present. No left atrial/left atrial appendage thrombus was detected. Right Atrium: Right atrial size was normal in size. Pericardium:  There is no evidence of pericardial effusion. Mitral Valve: The mitral valve is normal in structure. Mild mitral valve regurgitation. No evidence of mitral valve stenosis. Tricuspid Valve: The tricuspid valve is normal in structure. Tricuspid valve regurgitation is trivial. No evidence of tricuspid stenosis. Aortic Valve: The aortic valve is normal in structure. Aortic valve regurgitation is not visualized. No aortic stenosis is present. Pulmonic Valve: The pulmonic valve was normal in structure. Pulmonic valve regurgitation is not visualized. No evidence of pulmonic stenosis. Aorta: The aortic root is normal in size and structure. Venous: The inferior vena cava is normal in size with greater than 50% respiratory variability, suggesting right atrial pressure of 3 mmHg. IAS/Shunts: No atrial level shunt detected by color flow Doppler. Agitated saline contrast was given intravenously to evaluate for intracardiac shunting. Agitated saline contrast bubble study was negative, with no evidence of any interatrial shunt. There  is no evidence of a patent foramen ovale. There is no evidence of an atrial septal defect. Adrian BlackwaterShaukat Symia Herdt Electronically signed by Adrian BlackwaterShaukat Sritha Chauncey Signature Date/Time: 11/25/2021/12:49:09 PM    Final    CT HEAD CODE STROKE WO CONTRAST  Addendum Date: 11/24/2021   ADDENDUM REPORT: 11/24/2021 04:37 ADDENDUM: Study discussed by telephone with Dr. Roxan Hockeyobinson in the ED on 11/24/2021 at 0428 hours. Electronically Signed   By: Odessa FlemingH  Hall M.D.   On: 11/24/2021 04:37   Result Date: 11/24/2021 CLINICAL DATA:  Code stroke. 41 year old female with neurologic deficit onset 0355 hours. Right facial droop and some aphasia. EXAM: CT HEAD WITHOUT  CONTRAST TECHNIQUE: Contiguous axial images were obtained from the base of the skull through the vertex without intravenous contrast. RADIATION DOSE REDUCTION: This exam was performed according to the departmental dose-optimization program which includes automated exposure control, adjustment of the mA and/or kV according to patient size and/or use of iterative reconstruction technique. COMPARISON:  Brain MRI and head CT, CTA head and neck and CT Perfusion 10/25/2021. FINDINGS: Brain: Normal cerebral volume. No midline shift, ventriculomegaly, mass effect, evidence of mass lesion, intracranial hemorrhage or evidence of cortically based acute infarction. Gray-white matter differentiation is within normal limits throughout the brain. Partially empty sella redemonstrated. Vascular: No suspicious intracranial vascular hyperdensity. Skull: No acute osseous abnormality identified. 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 Otherwise Visualized paranasal sinuses and mastoids are clear. Other: Mild leftward gaze. Visualized scalp soft tissues are within normal limits. ASPECTS [facility]([place] Stroke Program Early CT Score) Total score (0-10 with 10 being normal): 10 IMPRESSION: Stable and normal noncontrast CT appearance of the brain. ASPECTS 10. Electronically Signed: By: Odessa FlemingH  Hall M.D. On: 11/24/2021 04:24     ASSESSMENT AND PLAN: Transesophageal echocardiogram was done without complication.  Patient has no evidence of thrombi in left atrium, left ventricle, left atrial appendage.  There was evidence of smoke or spontaneous contrast in the right ventricle which can be a harbinger of thromboembolic phenomenon in the future.  If there is a high suspicion of CVA or TIA may consider anticoagulation.  But there is no obvious thrombi seen in heart chambers.  Ellajane Stong A

## 2021-11-25 NOTE — Progress Notes (Signed)
Patient being discharged home. IV removed. Patient going home POV with her daughter.

## 2021-11-25 NOTE — Progress Notes (Signed)
OT Cancellation Note  Patient Details Name: Kayla [name] MRN: [id number] DOB: 13-Mar-1981   Cancelled Treatment:    Reason Eval/Treat Not Completed: Patient at procedure or test/ unavailable. Pt out of the room, per chart plan for TEE today. Will re-attempt OT tx at later date/time as pt is available.   Ardeth Perfect., MPH, MS, OTR/L ascom [phone] 11/25/21, 11:41 AM

## 2021-11-25 NOTE — Discharge Summary (Signed)
Physician Discharge Summary   Kayla [name]  female DOB: [birth year]  FXO:[id number]  PCP: Zada Finders, PA-C  Admit date: 11/24/2021 Discharge date: 11/25/2021  Admitted From: home Disposition:  home CODE STATUS: Full code  Discharge Instructions     Ambulatory referral to Neurology   Complete by: As directed    An appointment is requested in approximately: 4-6 weeks   Discharge instructions   Complete by: As directed    Brain MRI again showed no stroke.  There is no blood clots seen in your heart.  Neurology recommended taking ASA 81 mg and plavix 75 mg daily together for 21 days, and then after that just ASA 81 mg daily alone.  Neurology recommended cardiac monitor for 30 days.  Please go to cardiology clinic Dr. Milta Deiters office right after discharge to have the cardiac monitor placed.  Please follow up with outpatient neurology 4-6 weeks after discharge.   Dr. Darlin Priestly [facility] Course:  For full details, please see H&P, progress notes, consult notes and ancillary notes.  Briefly,  Kayla [name] is a 41 y.o. female with with multiple strokelike episodes, for 1 of these also received IV thrombolysis, with MRI negative for strokes who presented to the ER for evaluation of right-sided facial droop and aphasia.  She has recently seen by neurologist who discontinued her Plavix on 01/31 and cleared her for return to work.  Patient states symptoms started at about 3:35 AM with an occipital headache, nausea, dizziness, lightheadedness and word-finding difficulty.  Her coworker immediately took her to the ER for further evaluation.  Possible recurrent TIA's --repeat MRI brain during this hospitalization again neg for stroke. --TEE done-unremarkable for intracardiac thrombus but some concern for smoke in the right ventricle which might indicate slow flow and has sometimes been reported to be associated with increased time embolic events although no definitive clot  seen. --Neuro rec discharge on 30-day cardiac monitor to evaluate for atrial fibrillation, which pt will pick up from Dr. Milta Deiters clinic after discharge. --Outpatient follow-up with neurology in 4 to 6 weeks --Outpatient cardiology follow-up with consideration for repeat 2D echocardiogram in the next few months to get a more understanding of any abnormality that might exist given the somewhat equivocal TEE findings. --Continue aspirin 81 and Plavix 75 for 3 more weeks followed by aspirin only. --cont home atorvastatin 40   Headache --per neuro,  "For the incidental finding of the empty sella, which can sometimes be associated with idiopathic intracranial hypertension, she does not describe anything in her history that makes me think that she has the IIHT type of headache.  I would defer her headache evaluation and management to the outpatient neurology appointment."   Nicotine dependence Smoking cessation has been discussed with patient in detail --prescribed nicotine transdermal patch    Morbid obesity (BMI 40) Complicates overall prognosis and care   Discharge Diagnoses:  Principal Problem:   TIA (transient ischemic attack) Active Problems:   Essential hypertension   Tobacco abuse   Obesity, Class III, BMI 40-49.9 (morbid obesity) (HCC)   History of CVA (cerebrovascular accident)   30 Day Unplanned Readmission Risk Score    Flowsheet Row ED to Hosp-Admission (Discharged) from 10/24/2021 in [facility] (1C)  30 Day Unplanned Readmission Risk Score (%) 9.76 Filed at 10/26/2021 0801       This score is the patient's risk of an unplanned readmission within 30 days of  being discharged (0 -100%). The score is based on dignosis, age, lab data, medications, orders, and past utilization.   Low:  0-14.9   Medium: 15-21.9   High: 22-29.9   Extreme: 30 and above         Discharge Instructions:  Allergies as of 11/25/2021       Reactions   Eszopiclone     Levaquin [levofloxacin] Other (See Comments)   paranoid   Sulfa Antibiotics Rash        Medication List     TAKE these medications    acetaminophen 500 MG tablet Commonly known as: TYLENOL Take 1,000 mg by mouth every 8 (eight) hours as needed for mild pain.   albuterol 108 (90 Base) MCG/ACT inhaler Commonly known as: VENTOLIN HFA Inhale 2 puffs into the lungs every 6 (six) hours as needed for wheezing or shortness of breath.   amLODipine 5 MG tablet Commonly known as: NORVASC Take 5 mg by mouth daily.   aspirin EC 81 MG tablet Take 1 tablet (81 mg total) by mouth daily. Swallow whole.   atorvastatin 40 MG tablet Commonly known as: LIPITOR Take 40 mg by mouth daily. What changed: Another medication with the same name was removed. Continue taking this medication, and follow the directions you see here.   clopidogrel 75 MG tablet Commonly known as: Plavix Take 1 tablet (75 mg total) by mouth daily for 21 days.   metoprolol tartrate 25 MG tablet Commonly known as: LOPRESSOR Take 25 mg by mouth daily.   nicotine 7 mg/24hr patch Commonly known as: NICODERM CQ - dosed in mg/24 hr Place 1 patch (7 mg total) onto the skin daily. Start taking on: November 26, 2021         Follow-up Information     Laurier NancyKhan, Shaukat A, MD Follow up.   Specialty: Cardiology Why: go pick up cardiac monitor after discharge Contact information: [address] [place] [address] [phone]         Swatzel, Hayley E, PA-C Follow up in 1 week(s).   Specialty: Physician Assistant Contact information: [address] [place] [phone]         Morene CrockerPotter, Zachary E, MD Follow up in 4 week(s).   Specialty: Neurology Contact information: [address] [facility] [place] [address] [phone]                 Allergies  Allergen Reactions   Eszopiclone    Levaquin [Levofloxacin] Other (See Comments)     paranoid   Sulfa Antibiotics Rash     The results of significant diagnostics from this hospitalization (including imaging, microbiology, ancillary and laboratory) are listed below for reference.   Consultations:   Procedures/Studies: MR Brain W and Wo Contrast  Result Date: 11/24/2021 CLINICAL DATA:  41 year old female code stroke presentation. Right facial droop and aphasia. EXAM: MRI HEAD WITHOUT AND WITH CONTRAST TECHNIQUE: Multiplanar, multiecho pulse sequences of the brain and surrounding structures were obtained without and with intravenous contrast. CONTRAST:  10mL GADAVIST GADOBUTROL 1 MMOL/ML IV SOLN COMPARISON:  Plain head CT this morning.  Brain MRI, CTA CTP 1 723. FINDINGS: Brain: Partially empty sella appearance once again noted. No restricted diffusion to suggest acute infarction. No midline shift, mass effect, evidence of mass lesion, ventriculomegaly, extra-axial collection or acute intracranial hemorrhage. Cervicomedullary junction is within normal limits. No abnormal enhancement identified. No dural thickening. No chronic cerebral blood products or encephalomalacia identified. Wallace CullensGray and white matter signal remains within  normal limits throughout the brain. Vascular: Major intracranial vascular flow voids are stable since January. Dominant left vertebral artery as before. The major dural venous sinuses are enhancing and appear to be patent. There is an effaced appearance of the transverse and sigmoid venous sinus junctions. Skull and upper cervical spine: Negative visible cervical spine. Visualized bone marrow signal is within normal limits. Sinuses/Orbits: Stable prominent CSF in the bilateral optic nerve root sleeves. No papilledema is evident. Paranasal sinuses and mastoids are well aerated. Other: Visible internal auditory structures appear normal. Negative visible scalp and face. IMPRESSION: No acute intracranial abnormality. Stable MRI appearance of the brain since January,  remarkable only for several features which can be seen in the setting of idiopathic intracranial hypertension (pseudotumor cerebri). Query Papilledema. CSF opening pressure would best evaluate further. Electronically Signed   By: [name]   On: 11/24/2021 06:22   US Venous Img Lower Bilateral (DVT)  Result Date: 11/24/2021 CLINICAL DATA:  Deep venous thrombosis Pain Varicose veins EXAM: BILATERAL LOWER EXTREMITY VENOUS DOPPLER ULTRASOUND TECHNIQUE: Gray-scale sonography with compression, as well as color and duplex ultrasound, were performed to evaluate the deep venous system(s) from the level of the common femoral vein through the popliteal and proximal calf veins. COMPARISON:  None. FINDINGS: VENOUS Normal compressibility of the common femoral, superficial femoral, and popliteal veins, as well as the visualized calf veins. Visualized portions of profunda femoral vein and great saphenous vein unremarkable. No filling defects to suggest DVT on grayscale or color Doppler imaging. Doppler waveforms show normal direction of venous flow, normal respiratory plasticity and response to augmentation. OTHER None. Limitations: Limited visualization of the left calf veins. IMPRESSION: No lower extremity DVT. Electronically Signed   By: Acquanetta Belling M.D.   On: 11/24/2021 11:41   ECHO TEE  Result Date: 11/25/2021    TRANSESOPHOGEAL ECHO REPORT   Patient Name:   Kayla [name] Date of Exam: 11/25/2021 Medical Rec #:  [id number]    Height:       69.0 in Accession #:    [id number]   Weight:       272.0 lb Date of Birth:  May 20, 1981   BSA:          2.354 m Patient Age:    40 years     BP:           102/68 mmHg Patient Gender: F            HR:           79 bpm. Exam Location:  [facility] Procedure: Transesophageal Echo, Cardiac Doppler, Color Doppler and Saline            Contrast Bubble Study Indications:     Not listed on TEE check-in sheet.  History:         Patient has prior history of Echocardiogram examinations, most                   recent 10/25/2021. No past medical history on file.  Sonographer:     Cristela Blue Referring Phys:  [id number] AMBER SCOGGINS Diagnosing Phys: Adrian Blackwater PROCEDURE: The transesophogeal probe was passed without difficulty through the esophogus of the patient. Sedation performed by performing physician. The patient's vital signs; including heart rate, blood pressure, and oxygen saturation; remained stable throughout the procedure. The patient developed no complications during the procedure. IMPRESSIONS  1. Left ventricular ejection fraction, by estimation, is 60 to 65%. The left ventricle has normal function. The  left ventricle has no regional wall motion abnormalities.  2. Right ventricular systolic function is normal. The right ventricular size is normal.  3. Spontaneous noted in right ventricle. No left atrial/left atrial appendage thrombus was detected.  4. The mitral valve is normal in structure. Mild mitral valve regurgitation. No evidence of mitral stenosis.  5. The aortic valve is normal in structure. Aortic valve regurgitation is not visualized. No aortic stenosis is present.  6. The inferior vena cava is normal in size with greater than 50% respiratory variability, suggesting right atrial pressure of 3 mmHg.  7. Agitated saline contrast bubble study was negative, with no evidence of any interatrial shunt. Conclusion(s)/Recommendation(s): Normal biventricular function without evidence of hemodynamically significant valvular heart disease. No thrombii seen but there were spontaneus contrast in RV noted. FINDINGS  Left Ventricle: Left ventricular ejection fraction, by estimation, is 60 to 65%. The left ventricle has normal function. The left ventricle has no regional wall motion abnormalities. The left ventricular internal cavity size was normal in size. There is  no left ventricular hypertrophy. Right Ventricle: The right ventricular size is normal. No increase in right ventricular wall thickness. Right  ventricular systolic function is normal. Left Atrium: Spontaneous noted in right ventricle. Left atrial size was normal in size. Spontaneous echo contrast was present. No left atrial/left atrial appendage thrombus was detected. Right Atrium: Right atrial size was normal in size. Pericardium: There is no evidence of pericardial effusion. Mitral Valve: The mitral valve is normal in structure. Mild mitral valve regurgitation. No evidence of mitral valve stenosis. Tricuspid Valve: The tricuspid valve is normal in structure. Tricuspid valve regurgitation is trivial. No evidence of tricuspid stenosis. Aortic Valve: The aortic valve is normal in structure. Aortic valve regurgitation is not visualized. No aortic stenosis is present. Pulmonic Valve: The pulmonic valve was normal in structure. Pulmonic valve regurgitation is not visualized. No evidence of pulmonic stenosis. Aorta: The aortic root is normal in size and structure. Venous: The inferior vena cava is normal in size with greater than 50% respiratory variability, suggesting right atrial pressure of 3 mmHg. IAS/Shunts: No atrial level shunt detected by color flow Doppler. Agitated saline contrast was given intravenously to evaluate for intracardiac shunting. Agitated saline contrast bubble study was negative, with no evidence of any interatrial shunt. There  is no evidence of a patent foramen ovale. There is no evidence of an atrial septal defect. Adrian Blackwater Electronically signed by Adrian Blackwater Signature Date/Time: 11/25/2021/12:49:09 PM    Final    CT HEAD CODE STROKE WO CONTRAST  Addendum Date: 11/24/2021   ADDENDUM REPORT: 11/24/2021 04:37 ADDENDUM: Study discussed by telephone with Dr. Roxan Hockey in the ED on 11/24/2021 at 0428 hours. Electronically Signed   By: [name]   On: 11/24/2021 04:37   Result Date: 11/24/2021 CLINICAL DATA:  Code stroke. 41 year old female with neurologic deficit onset 0355 hours. Right facial droop and some aphasia. EXAM: CT HEAD  WITHOUT CONTRAST TECHNIQUE: Contiguous axial images were obtained from the base of the skull through the vertex without intravenous contrast. RADIATION DOSE REDUCTION: This exam was performed according to the departmental dose-optimization program which includes automated exposure control, adjustment of the mA and/or kV according to patient size and/or use of iterative reconstruction technique. COMPARISON:  Brain MRI and head CT, CTA head and neck and CT Perfusion 10/25/2021. FINDINGS: Brain: Normal cerebral volume. No midline shift, ventriculomegaly, mass effect, evidence of mass lesion, intracranial hemorrhage or evidence of cortically based acute infarction. Gray-white matter  differentiation is within normal limits throughout the brain. Partially empty sella redemonstrated. Vascular: No suspicious intracranial vascular hyperdensity. Skull: No acute osseous abnormality identified. 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 Otherwise Visualized paranasal sinuses and mastoids are clear. Other: Mild leftward gaze. Visualized scalp soft tissues are within normal limits. ASPECTS [facility] Stroke Program Early CT Score) Total score (0-10 with 10 being normal): 10 IMPRESSION: Stable and normal noncontrast CT appearance of the brain. ASPECTS 10. Electronically Signed: By: [name] On: 11/24/2021 04:24      Labs: BNP (last 3 results) No results for input(s): BNP in the last 8760 hours. Basic Metabolic Panel: Recent Labs  Lab 11/24/21 0411  NA 138  K 3.6  CL 109  CO2 22  GLUCOSE 116*  BUN 18  CREATININE 0.80  CALCIUM 9.2   Liver Function Tests: Recent Labs  Lab 11/24/21 0411  AST 34  ALT 30  ALKPHOS 64  BILITOT 0.5  PROT 7.4  ALBUMIN 4.0   No results for input(s): LIPASE, AMYLASE in the last 168 hours. No results for input(s): AMMONIA in the last 168 hours. CBC: Recent Labs  Lab 11/24/21 0411 11/24/21 0621  WBC 9.1 8.6  NEUTROABS 5.0  --   HGB 12.4  12.1  HCT 37.9 36.7  MCV 92.2 92.4  PLT 303 325   Cardiac Enzymes: No results for input(s): CKTOTAL, CKMB, CKMBINDEX, TROPONINI in the last 168 hours. BNP: Invalid input(s): POCBNP CBG: Recent Labs  Lab 11/24/21 0408  GLUCAP 76   D-Dimer No results for input(s): DDIMER in the last 72 hours. Hgb A1c Recent Labs    11/25/21 0449  HGBA1C 5.1   Lipid Profile Recent Labs    11/25/21 0449  CHOL 118  HDL 36*  LDLCALC 62  TRIG 98  CHOLHDL 3.3   Thyroid function studies No results for input(s): TSH, T4TOTAL, T3FREE, THYROIDAB in the last 72 hours.  Invalid input(s): FREET3 Anemia work up No results for input(s): VITAMINB12, FOLATE, FERRITIN, TIBC, IRON, RETICCTPCT in the last 72 hours. Urinalysis    Component Value Date/Time   COLORURINE YELLOW (A) 10/25/2021 0409   APPEARANCEUR HAZY (A) 10/25/2021 0409   LABSPEC 1.032 (H) 10/25/2021 0409   PHURINE 5.0 10/25/2021 0409   GLUCOSEU NEGATIVE 10/25/2021 0409   HGBUR SMALL (A) 10/25/2021 0409   BILIRUBINUR NEGATIVE 10/25/2021 0409   KETONESUR NEGATIVE 10/25/2021 0409   PROTEINUR NEGATIVE 10/25/2021 0409   NITRITE NEGATIVE 10/25/2021 0409   LEUKOCYTESUR NEGATIVE 10/25/2021 0409   Sepsis Labs Invalid input(s): PROCALCITONIN,  WBC,  LACTICIDVEN Microbiology Recent Results (from the past 240 hour(s))  Resp Panel by RT-PCR (Flu A&B, Covid) Nasopharyngeal Swab     Status: None   Collection Time: 11/24/21  4:11 AM   Specimen: Nasopharyngeal Swab; Nasopharyngeal(NP) swabs in vial transport medium  Result Value Ref Range Status   SARS Coronavirus 2 by RT PCR NEGATIVE NEGATIVE Final    Comment: (NOTE) SARS-CoV-2 target nucleic acids are NOT DETECTED.  The SARS-CoV-2 RNA is generally detectable in upper respiratory specimens during the acute phase of infection. The lowest concentration of SARS-CoV-2 viral copies this assay can detect is 138 copies/mL. A negative result does not preclude SARS-Cov-2 infection and should not  be used as the sole basis for treatment or other patient management decisions. A negative result may occur with  improper specimen collection/handling, submission of specimen other than nasopharyngeal swab, presence of viral mutation(s) within the areas targeted by this assay, and inadequate number of  viral copies(<138 copies/mL). A negative result must be combined with clinical observations, patient history, and epidemiological information. The expected result is Negative.  Fact Sheet for Patients:  BloggerCourse.comhttps://www.fda.gov/media/152166/download  Fact Sheet for Healthcare Providers:  SeriousBroker.ithttps://www.fda.gov/media/152162/download  This test is no t yet approved or cleared by the [place] FDA and  has been authorized for detection and/or diagnosis of SARS-CoV-2 by FDA under an Emergency Use Authorization (EUA). This EUA will remain  in effect (meaning this test can be used) for the duration of the COVID-19 declaration under Section 564(b)(1) of the Act, 21 U.S.C.section 360bbb-3(b)(1), unless the authorization is terminated  or revoked sooner.       Influenza A by PCR NEGATIVE NEGATIVE Final   Influenza B by PCR NEGATIVE NEGATIVE Final    Comment: (NOTE) The Xpert Xpress SARS-CoV-2/FLU/RSV plus assay is intended as an aid in the diagnosis of influenza from Nasopharyngeal swab specimens and should not be used as a sole basis for treatment. Nasal washings and aspirates are unacceptable for Xpert Xpress SARS-CoV-2/FLU/RSV testing.  Fact Sheet for Patients: BloggerCourse.comhttps://www.fda.gov/media/152166/download  Fact Sheet for Healthcare Providers: SeriousBroker.ithttps://www.fda.gov/media/152162/download  This test is not yet approved or cleared by the [place] FDA and has been authorized for detection and/or diagnosis of SARS-CoV-2 by FDA under an Emergency Use Authorization (EUA). This EUA will remain in effect (meaning this test can be used) for the duration of the COVID-19 declaration under Section  564(b)(1) of the Act, 21 U.S.C. section 360bbb-3(b)(1), unless the authorization is terminated or revoked.  Performed at [facility], [address] [place], [place] [address]      Total time spend on discharging this patient, including the last patient exam, discussing the hospital stay, instructions for ongoing care as it relates to all pertinent caregivers, as well as preparing the medical discharge records, prescriptions, and/or referrals as applicable, is 40 minutes.    Darlin Priestlyina Glayds [place], MD  [facility] 11/25/2021, 3:31 PM

## 2021-11-26 ENCOUNTER — Encounter: Payer: Self-pay | Admitting: Cardiovascular Disease

## 2023-04-09 ENCOUNTER — Telehealth: Payer: 59 | Admitting: Physician Assistant

## 2023-04-09 DIAGNOSIS — B3731 Acute candidiasis of vulva and vagina: Secondary | ICD-10-CM | POA: Diagnosis not present

## 2023-04-09 MED ORDER — FLUCONAZOLE 150 MG PO TABS
150.0000 mg | ORAL_TABLET | Freq: Once | ORAL | 0 refills | Status: AC
Start: 2023-04-09 — End: 2023-04-09

## 2023-04-09 NOTE — Progress Notes (Signed)

## 2023-04-10 ENCOUNTER — Ambulatory Visit: Payer: Self-pay

## 2023-04-10 ENCOUNTER — Ambulatory Visit (HOSPITAL_COMMUNITY)
Admission: RE | Admit: 2023-04-10 | Discharge: 2023-04-10 | Disposition: A | Discharge status: Home / Self Care | Payer: 59 | Source: Ambulatory Visit | Attending: Emergency Medicine | Admitting: Emergency Medicine

## 2023-04-10 ENCOUNTER — Encounter (HOSPITAL_COMMUNITY): Payer: Self-pay

## 2023-04-10 ENCOUNTER — Ambulatory Visit: Payer: 59

## 2023-04-10 VITALS — BP 136/76 | HR 102 | Temp 98.2°F | Resp 18

## 2023-04-10 DIAGNOSIS — Z113 Encounter for screening for infections with a predominantly sexual mode of transmission: Secondary | ICD-10-CM | POA: Insufficient documentation

## 2023-04-10 DIAGNOSIS — N760 Acute vaginitis: Secondary | ICD-10-CM | POA: Diagnosis not present

## 2023-04-10 LAB — POCT URINALYSIS DIP (MANUAL ENTRY)
Glucose, UA: NEGATIVE mg/dL
Nitrite, UA: NEGATIVE
Protein Ur, POC: NEGATIVE mg/dL
Spec Grav, UA: 1.025 (ref 1.010–1.025)
Urobilinogen, UA: 0.2 E.U./dL
pH, UA: 6.5 (ref 5.0–8.0)

## 2023-04-10 LAB — HIV ANTIBODY (ROUTINE TESTING W REFLEX): HIV Screen 4th Generation wRfx: NONREACTIVE

## 2023-04-10 MED ORDER — METRONIDAZOLE 500 MG PO TABS: 500.0000 mg | ORAL_TABLET | Freq: Two times a day (BID) | ORAL | 0 refills | Status: DC

## 2023-04-10 NOTE — ED Provider Notes (Signed)
[facility]    CSN: [id number] Arrival date & time: 04/10/23  1706      History   Chief Complaint Chief Complaint  Patient presents with   Vaginal Itching    Entered by patient    HPI Kayla [name] is a 42 y.o. female.   Patient presents to clinic for complaint of vaginal itching that has been intensifying. She was sexually active two weeks ago after being inactive for a while. She had a virtual visit where she was prescribed a single dose of Diflucan.  She does work in the lab, did her own wet prep and noticed trichomoniasis swimming and one of the top corners.  She reports her discomfort is worsening.  Has not yet taken the Diflucan.  Would like screening for HIV and syphilis.   The history is provided by the patient and medical records.  Vaginal Itching    History reviewed. No pertinent past medical history.  Patient Active Problem List   Diagnosis Date Noted   History of CVA (cerebrovascular accident) 11/24/2021   Obesity, Class III, BMI 40-49.9 (morbid obesity) (HCC)    Facial paresthesia 10/25/2021   Essential hypertension 10/25/2021   Tobacco abuse 10/25/2021   Hypotension    TIA (transient ischemic attack)    Facial weakness    Empty sella (HCC)     Past Surgical History:  Procedure Laterality Date   APPENDECTOMY     CHOLECYSTECTOMY     COLONOSCOPY     DILATION AND CURETTAGE OF UTERUS     ESOPHAGOGASTRODUODENOSCOPY     KNEE SURGERY     TEE WITHOUT CARDIOVERSION N/A 11/25/2021   Procedure: TRANSESOPHAGEAL ECHOCARDIOGRAM (TEE);  Surgeon: Laurier Nancy, MD;  Location: [facility];  Service: Cardiovascular;  Laterality: N/A;   TUBAL LIGATION     uterine ablation      OB History   No obstetric history on file.      Home Medications    Prior to Admission medications   Medication Sig Start Date End Date Taking? Authorizing Provider  metroNIDAZOLE (FLAGYL) 500 MG tablet Take 1 tablet (500 mg total) by mouth 2 (two) times daily. 04/10/23  Yes  Rinaldo Ratel, [place], FNP  acetaminophen (TYLENOL) 500 MG tablet Take 1,000 mg by mouth every 8 (eight) hours as needed for mild pain.    [provider]  albuterol (VENTOLIN HFA) 108 (90 Base) MCG/ACT inhaler Inhale 2 puffs into the lungs every 6 (six) hours as needed for wheezing or shortness of breath.    [provider]  amLODipine (NORVASC) 5 MG tablet Take 5 mg by mouth daily. 11/19/21   [provider]  aspirin EC 81 MG tablet Take 1 tablet (81 mg total) by mouth daily. Swallow whole. 10/25/21   Alford Highland, MD  atorvastatin (LIPITOR) 40 MG tablet Take 40 mg by mouth daily. 11/18/21   [provider]  metoprolol tartrate (LOPRESSOR) 25 MG tablet Take 25 mg by mouth daily. 11/14/21   [provider]  nicotine (NICODERM CQ - DOSED IN MG/24 HR) 7 mg/24hr patch Place 1 patch (7 mg total) onto the skin daily. 11/26/21   Darlin Priestly, MD    Family History Family History  Problem Relation Age of Onset   Diabetes Mother    Hypertension Mother     Social History Social History   Tobacco Use   Smoking status: Some Days    Types: Cigarettes  Substance Use Topics   Alcohol use: Yes  Allergies   Eszopiclone, Levaquin [levofloxacin], and Sulfa antibiotics   Review of Systems Review of Systems  Constitutional:  Negative for fever.  Genitourinary:  Positive for vaginal discharge. Negative for dysuria.     Physical Exam Triage Vital Signs ED Triage Vitals [04/10/23 1716]  Enc Vitals Group     BP 136/76     Pulse Rate (!) 102     Resp 18     Temp 98.2 F (36.8 C)     Temp Source Oral     SpO2 95 %     Weight      Height      Head Circumference      Peak Flow      Pain Score      Pain Loc      Pain Edu?      Excl. in GC?    No data found.  Updated Vital Signs BP 136/76 (BP Location: Left Arm)   Pulse (!) 102   Temp 98.2 F (36.8 C) (Oral)   Resp 18   SpO2 95%   Visual Acuity Right Eye Distance:   Left Eye Distance:    Bilateral Distance:    Right Eye Near:   Left Eye Near:    Bilateral Near:     Physical Exam Vitals and nursing note reviewed.  Constitutional:      Appearance: Normal appearance.  HENT:     Head: Normocephalic and atraumatic.     Right Ear: External ear normal.     Left Ear: External ear normal.     Nose: Nose normal.     Mouth/Throat:     Mouth: Mucous membranes are moist.  Eyes:     Conjunctiva/sclera: Conjunctivae normal.  Cardiovascular:     Rate and Rhythm: Normal rate.  Pulmonary:     Effort: Pulmonary effort is normal. No respiratory distress.  Musculoskeletal:        General: Normal range of motion.  Skin:    General: Skin is warm.  Neurological:     General: No focal deficit present.     Mental Status: She is alert.  Psychiatric:        Mood and Affect: Mood normal.      UC Treatments / Results  Labs (all labs ordered are listed, but only abnormal results are displayed) Labs Reviewed  POCT URINALYSIS DIP (MANUAL ENTRY) - Abnormal; Notable for the following components:      Result Value   Bilirubin, UA small (*)    Ketones, POC UA small (15) (*)    Blood, UA small (*)    Leukocytes, UA Small (1+) (*)    All other components within normal limits  RPR  HIV ANTIBODY (ROUTINE TESTING W REFLEX)  CERVICOVAGINAL ANCILLARY ONLY    EKG   Radiology No results found.  Procedures Procedures (including critical care time)  Medications Ordered in UC Medications - No data to display  Initial Impression / Assessment and Plan / UC Course  I have reviewed the triage vital signs and the nursing notes.  Pertinent labs & imaging results that were available during my care of the patient were reviewed by me and considered in my medical decision making (see chart for details).  Vitals and triage reviewed, patient is hemodynamically stable.  Vaginal itching after sexual intercourse.  Visualized trichomoniasis on her own wet prep at the lab.  Will treat with  metronidazole, cytology swab and screening for HIV and syphilis obtained.  Staff to contact  if we need to modify treatment.  Plan of care, follow-up care and return precautions given, no questions at this time.   Final Clinical Impressions(s) / UC Diagnoses   Final diagnoses:  Acute vaginitis  Screening examination for sexually transmitted disease     Discharge Instructions      We have obtained labs today to screen for sexually transmitted infection.  I am covering you with metronidazole twice daily for the next 7 days due to your visualization of the trichomoniasis.  Please take all antibiotics as prescribed and until finished, you can take them with food to help prevent gastrointestinal upset.  Our staff will contact you if anything results is positive and initiate the appropriate treatment, if indicated.  Abstain from intercourse until all results are received.  If positive, please notify any partners.  Return to clinic for any new or urgent symptoms.      ED Prescriptions     Medication Sig Dispense Auth. Provider   metroNIDAZOLE (FLAGYL) 500 MG tablet Take 1 tablet (500 mg total) by mouth 2 (two) times daily. 14 tablet Saber Dickerman, [place] N, [place]      PDMP not reviewed this encounter.   Maryellen Dowdle, [place] N, [place] 04/10/23 1734

## 2023-04-10 NOTE — Discharge Instructions (Addendum)
We have obtained labs today to screen for sexually transmitted infection.  I am covering you with metronidazole twice daily for the next 7 days due to your visualization of the trichomoniasis.  Please take all antibiotics as prescribed and until finished, you can take them with food to help prevent gastrointestinal upset.  Our staff will contact you if anything results is positive and initiate the appropriate treatment, if indicated.  Abstain from intercourse until all results are received.  If positive, please notify any partners.  Return to clinic for any new or urgent symptoms.

## 2023-04-10 NOTE — ED Triage Notes (Addendum)
Pt presents with vaginal itching and burning. Pt had a one night stand 2 weeks ago. Pt is concern about STI.

## 2023-04-11 LAB — RPR: RPR Ser Ql: NONREACTIVE

## 2023-04-13 LAB — CERVICOVAGINAL ANCILLARY ONLY
Bacterial Vaginitis (gardnerella): POSITIVE — AB
Candida Glabrata: NEGATIVE
Candida Vaginitis: NEGATIVE
Chlamydia: NEGATIVE
Comment: NEGATIVE
Comment: NEGATIVE
Comment: NEGATIVE
Comment: NEGATIVE
Comment: NEGATIVE
Comment: NORMAL
Neisseria Gonorrhea: NEGATIVE
Trichomonas: POSITIVE — AB

## 2023-04-15 ENCOUNTER — Ambulatory Visit: Payer: BLUE CROSS/BLUE SHIELD | Admitting: Orthopedic Surgery

## 2023-04-28 ENCOUNTER — Encounter: Payer: 59 | Admitting: Adult Health

## 2023-04-29 NOTE — Progress Notes (Signed)
This encounter was created in error - please disregard.

## 2023-06-07 ENCOUNTER — Ambulatory Visit
Admission: RE | Admit: 2023-06-07 | Discharge: 2023-06-07 | Disposition: A | Discharge status: Home / Self Care | Payer: 59 | Source: Ambulatory Visit | Attending: Family Medicine | Admitting: Family Medicine

## 2023-06-07 ENCOUNTER — Other Ambulatory Visit: Payer: Self-pay

## 2023-06-07 ENCOUNTER — Ambulatory Visit (HOSPITAL_COMMUNITY): Payer: 59

## 2023-06-07 VITALS — BP 137/83 | HR 101 | Temp 100.3°F | Resp 18

## 2023-06-07 DIAGNOSIS — R509 Fever, unspecified: Secondary | ICD-10-CM | POA: Diagnosis not present

## 2023-06-07 DIAGNOSIS — J069 Acute upper respiratory infection, unspecified: Secondary | ICD-10-CM | POA: Diagnosis not present

## 2023-06-07 DIAGNOSIS — R059 Cough, unspecified: Secondary | ICD-10-CM | POA: Diagnosis not present

## 2023-06-07 LAB — POC SARS CORONAVIRUS 2 AG -  ED: SARS Coronavirus 2 Ag: NEGATIVE

## 2023-06-07 MED ORDER — BENZONATATE 200 MG PO CAPS: 200.0000 mg | ORAL_CAPSULE | Freq: Three times a day (TID) | ORAL | 0 refills | Status: AC | PRN

## 2023-06-07 MED ORDER — PROMETHAZINE-DM 6.25-15 MG/5ML PO SYRP: 5.0000 mL | ORAL_SOLUTION | Freq: Two times a day (BID) | ORAL | 0 refills | Status: DC | PRN

## 2023-06-07 NOTE — ED Provider Notes (Signed)
[facility]    CSN: [id number] Arrival date & time: 06/07/23  1459      History   Chief Complaint Chief Complaint  Patient presents with   Fever    Cough body aches - Entered by patient    HPI Kayla [name] is a 42 y.o. female.   HPI Pleasant 42 year old female presents with cough and fever for 2 days with bodyaches.  Patient reports cough has been keeping her awake at night.  PMH significant for obesity, TIA, HTN, and tobacco abuse.  History reviewed. No pertinent past medical history.  Patient Active Problem List   Diagnosis Date Noted   History of CVA (cerebrovascular accident) 11/24/2021   Obesity, Class III, BMI 40-49.9 (morbid obesity) (HCC)    Facial paresthesia 10/25/2021   Essential hypertension 10/25/2021   Tobacco abuse 10/25/2021   Hypotension    TIA (transient ischemic attack)    Facial weakness    Empty sella (HCC)     Past Surgical History:  Procedure Laterality Date   APPENDECTOMY     CHOLECYSTECTOMY     COLONOSCOPY     DILATION AND CURETTAGE OF UTERUS     ESOPHAGOGASTRODUODENOSCOPY     KNEE SURGERY     TEE WITHOUT CARDIOVERSION N/A 11/25/2021   Procedure: TRANSESOPHAGEAL ECHOCARDIOGRAM (TEE);  Surgeon: Laurier Nancy, MD;  Location: [facility];  Service: Cardiovascular;  Laterality: N/A;   TUBAL LIGATION     uterine ablation      OB History   No obstetric history on file.      Home Medications    Prior to Admission medications   Medication Sig Start Date End Date Taking? Authorizing Provider  benzonatate (TESSALON) 200 MG capsule Take 1 capsule (200 mg total) by mouth 3 (three) times daily as needed for up to 7 days. 06/07/23 06/14/23 Yes Trevor Iha, FNP  promethazine-dextromethorphan (PROMETHAZINE-DM) 6.25-15 MG/5ML syrup Take 5 mLs by mouth 2 (two) times daily as needed for cough. 06/07/23  Yes Trevor Iha, FNP  acetaminophen (TYLENOL) 500 MG tablet Take 1,000 mg by mouth every 8 (eight) hours as needed for mild pain.     [provider]  albuterol (VENTOLIN HFA) 108 (90 Base) MCG/ACT inhaler Inhale 2 puffs into the lungs every 6 (six) hours as needed for wheezing or shortness of breath.    [provider]  amLODipine (NORVASC) 5 MG tablet Take 5 mg by mouth daily. 11/19/21   [provider]  aspirin EC 81 MG tablet Take 1 tablet (81 mg total) by mouth daily. Swallow whole. 10/25/21   Alford Highland, MD  atorvastatin (LIPITOR) 40 MG tablet Take 40 mg by mouth daily. 11/18/21   [provider]  metoprolol tartrate (LOPRESSOR) 25 MG tablet Take 25 mg by mouth daily. 11/14/21   [provider]  metroNIDAZOLE (FLAGYL) 500 MG tablet Take 1 tablet (500 mg total) by mouth 2 (two) times daily. 04/10/23   Garrison, [place] N, FNP  nicotine (NICODERM CQ - DOSED IN MG/24 HR) 7 mg/24hr patch Place 1 patch (7 mg total) onto the skin daily. 11/26/21   Darlin Priestly, MD    Family History Family History  Problem Relation Age of Onset   Diabetes Mother    Hypertension Mother     Social History Social History   Tobacco Use   Smoking status: Some Days    Types: Cigarettes  Substance Use Topics   Alcohol use: Yes     Allergies   Eszopiclone, Levaquin [  levofloxacin], and Sulfa antibiotics   Review of Systems Review of Systems  Constitutional:  Positive for fever.  Respiratory:  Positive for cough.   All other systems reviewed and are negative.    Physical Exam Triage Vital Signs ED Triage Vitals  Encounter Vitals Group     BP      Systolic BP Percentile      Diastolic BP Percentile      Pulse      Resp      Temp      Temp src      SpO2      Weight      Height      Head Circumference      Peak Flow      Pain Score      Pain Loc      Pain Education      Exclude from Growth Chart    No data found.  Updated Vital Signs BP 137/83 (BP Location: Left Arm)   Pulse (!) 101   Temp 100.3 F (37.9 C) (Oral)   Resp 18   SpO2 98%    Physical Exam Vitals and  nursing note reviewed.  Constitutional:      Appearance: Normal appearance. She is obese. She is ill-appearing.  HENT:     Head: Normocephalic and atraumatic.     Right Ear: Tympanic membrane, ear canal and external ear normal.     Left Ear: Tympanic membrane, ear canal and external ear normal.     Mouth/Throat:     Mouth: Mucous membranes are moist.     Pharynx: Oropharynx is clear.  Eyes:     Extraocular Movements: Extraocular movements intact.     Conjunctiva/sclera: Conjunctivae normal.     Pupils: Pupils are equal, round, and reactive to light.  Cardiovascular:     Rate and Rhythm: Normal rate and regular rhythm.     Pulses: Normal pulses.     Heart sounds: Normal heart sounds. No murmur heard. Pulmonary:     Effort: Pulmonary effort is normal.     Breath sounds: Normal breath sounds. No wheezing, rhonchi or rales.  Musculoskeletal:        General: Normal range of motion.     Cervical back: Normal range of motion and neck supple.  Skin:    General: Skin is warm and dry.  Neurological:     General: No focal deficit present.     Mental Status: She is alert and oriented to person, place, and time. Mental status is at baseline.  Psychiatric:        Mood and Affect: Mood normal.        Behavior: Behavior normal.        Thought Content: Thought content normal.      UC Treatments / Results  Labs (all labs ordered are listed, but only abnormal results are displayed) Labs Reviewed  POC SARS CORONAVIRUS 2 AG -  ED    EKG   Radiology No results found.  Procedures Procedures (including critical care time)  Medications Ordered in UC Medications - No data to display  Initial Impression / Assessment and Plan / UC Course  I have reviewed the triage vital signs and the nursing notes.  Pertinent labs & imaging results that were available during my care of the patient were reviewed by me and considered in my medical decision making (see chart for details).     MDM: 1.   Viral URI with cough-POCT COVID  negative; 2.  Fever, unspecified-advised patient may take OTC Tylenol 1000 mg every 6 hours for fever (oral temperature greater than 100.3); 3.  Cough, unspecified type-Rx'd Tessalon capsules 200 mg 3 times daily, as needed, Promethazine DM 6.25-15 mg / 5 mL syrup: Take 5 mL twice daily, as needed for cough. Advised patient may take OTC Tylenol 1 g every 6 hours for fever (oral temperature greater than 100.3).  Advised may take Tessalon capsules daily or as needed for cough.  Advised may use Promethazine DM at night for cough due to sedative effects.  Encouraged to increase daily water intake to 64 ounces per day while taking these medications.  Advised if symptoms worsen and/or unresolved please follow-up PCP or here for further evaluation.  Patient discharged home, he hemodynamically stable. Final Clinical Impressions(s) / UC Diagnoses   Final diagnoses:  Viral URI with cough  Fever, unspecified  Cough, unspecified type     Discharge Instructions      Advised patient may take OTC Tylenol 1 g every 6 hours for fever (oral temperature greater than 100.3).  Advised may take Tessalon capsules daily or as needed for cough.  Advised may use Promethazine DM at night for cough due to sedative effects.  Encouraged to increase daily water intake to 64 ounces per day while taking these medications.  Advised if symptoms worsen and/or unresolved please follow-up PCP or here for further evaluation.     ED Prescriptions     Medication Sig Dispense Auth. Provider   benzonatate (TESSALON) 200 MG capsule Take 1 capsule (200 mg total) by mouth 3 (three) times daily as needed for up to 7 days. 40 capsule Trevor Iha, FNP   promethazine-dextromethorphan (PROMETHAZINE-DM) 6.25-15 MG/5ML syrup Take 5 mLs by mouth 2 (two) times daily as needed for cough. 118 mL Trevor Iha, FNP      PDMP not reviewed this encounter.   Trevor Iha, FNP 06/07/23 1614

## 2023-06-07 NOTE — ED Triage Notes (Signed)
Patient presents to [facility] for evaluation of 2 days of cough, fever (which is relieved by fever reducers), body aches.  Patient states cough is the worst, can't sleep, has post-tussive emesis.

## 2023-06-07 NOTE — Discharge Instructions (Addendum)
Advised patient may take OTC Tylenol 1 g every 6 hours for fever (oral temperature greater than 100.3).  Advised may take Tessalon capsules daily or as needed for cough.  Advised may use Promethazine DM at night for cough due to sedative effects.  Encouraged to increase daily water intake to 64 ounces per day while taking these medications.  Advised if symptoms worsen and/or unresolved please follow-up PCP or here for further evaluation.

## 2023-06-16 ENCOUNTER — Ambulatory Visit
Admission: RE | Admit: 2023-06-16 | Discharge: 2023-06-16 | Disposition: A | Discharge status: Home / Self Care | Payer: 59 | Source: Ambulatory Visit | Attending: Family Medicine | Admitting: Family Medicine

## 2023-06-16 VITALS — BP 134/83 | HR 87 | Temp 98.5°F | Resp 17

## 2023-06-16 DIAGNOSIS — L0291 Cutaneous abscess, unspecified: Secondary | ICD-10-CM

## 2023-06-16 MED ORDER — DOXYCYCLINE HYCLATE 100 MG PO CAPS: 100.0000 mg | ORAL_CAPSULE | Freq: Two times a day (BID) | ORAL | 0 refills | Status: DC

## 2023-06-16 MED ORDER — HYDROCODONE-ACETAMINOPHEN 7.5-325 MG PO TABS: 1.0000 | ORAL_TABLET | Freq: Four times a day (QID) | ORAL | 0 refills | Status: DC | PRN

## 2023-06-16 NOTE — ED Provider Notes (Signed)
[facility]    CSN: [id number] Arrival date & time: 06/16/23  1759      History   Chief Complaint Chief Complaint  Patient presents with   Abscess    Groin area    HPI Kayla [name] is a 42 y.o. female.   HPI  York Spaniel she has a "hair bump" down in her pubic area.  It was small.  She states that overnight it got very large and painful.  Now it is the size of a golf ball.  She has been trying warm compresses.  She has never had abscess problems previously  History reviewed. No pertinent past medical history.  Patient Active Problem List   Diagnosis Date Noted   History of CVA (cerebrovascular accident) 11/24/2021   Obesity, Class III, BMI 40-49.9 (morbid obesity) (HCC)    Facial paresthesia 10/25/2021   Essential hypertension 10/25/2021   Tobacco abuse 10/25/2021   Hypotension    TIA (transient ischemic attack)    Facial weakness    Empty sella (HCC)     Past Surgical History:  Procedure Laterality Date   APPENDECTOMY     CHOLECYSTECTOMY     COLONOSCOPY     DILATION AND CURETTAGE OF UTERUS     ESOPHAGOGASTRODUODENOSCOPY     KNEE SURGERY     TEE WITHOUT CARDIOVERSION N/A 11/25/2021   Procedure: TRANSESOPHAGEAL ECHOCARDIOGRAM (TEE);  Surgeon: Laurier Nancy, MD;  Location: [facility];  Service: Cardiovascular;  Laterality: N/A;   TUBAL LIGATION     uterine ablation      OB History   No obstetric history on file.      Home Medications    Prior to Admission medications   Medication Sig Start Date End Date Taking? Authorizing Provider  doxycycline (VIBRAMYCIN) 100 MG capsule Take 1 capsule (100 mg total) by mouth 2 (two) times daily. 06/16/23  Yes Eustace Moore, MD  HYDROcodone-acetaminophen [facility]) 7.5-325 MG tablet Take 1 tablet by mouth every 6 (six) hours as needed for moderate pain. 06/16/23  Yes Eustace Moore, MD  acetaminophen (TYLENOL) 500 MG tablet Take 1,000 mg by mouth every 8 (eight) hours as needed for mild pain.    [provider]  albuterol (VENTOLIN HFA) 108 (90 Base) MCG/ACT inhaler Inhale 2 puffs into the lungs every 6 (six) hours as needed for wheezing or shortness of breath.    [provider]  amLODipine (NORVASC) 5 MG tablet Take 5 mg by mouth daily. 11/19/21   [provider]  aspirin EC 81 MG tablet Take 1 tablet (81 mg total) by mouth daily. Swallow whole. 10/25/21   Alford Highland, MD  atorvastatin (LIPITOR) 40 MG tablet Take 40 mg by mouth daily. 11/18/21   [provider]  nicotine (NICODERM CQ - DOSED IN MG/24 HR) 7 mg/24hr patch Place 1 patch (7 mg total) onto the skin daily. 11/26/21   Darlin Priestly, MD    Family History Family History  Problem Relation Age of Onset   Diabetes Mother    Hypertension Mother     Social History Social History   Tobacco Use   Smoking status: Some Days    Types: Cigarettes  Substance Use Topics   Alcohol use: Yes     Allergies   Eszopiclone, Levaquin [levofloxacin], and Sulfa antibiotics   Review of Systems Review of Systems See HPI  Physical Exam Triage Vital Signs ED Triage Vitals  Encounter Vitals Group     BP 06/16/23 1809 134/83  Systolic BP Percentile --      Diastolic BP Percentile --      Pulse Rate 06/16/23 1809 87     Resp 06/16/23 1809 17     Temp 06/16/23 1809 98.5 F (36.9 C)     Temp Source 06/16/23 1809 Oral     SpO2 06/16/23 1809 96 %     Weight --      Height --      Head Circumference --      Peak Flow --      Pain Score 06/16/23 1808 4     Pain Loc --      Pain Education --      Exclude from Growth Chart --    No data found.  Updated Vital Signs BP 134/83 (BP Location: Right Arm)   Pulse 87   Temp 98.5 F (36.9 C) (Oral)   Resp 17   SpO2 96%      Physical Exam Constitutional:      General: She is not in acute distress.    Appearance: She is well-developed. She is obese.  HENT:     Head: Normocephalic and atraumatic.  Eyes:     Conjunctiva/sclera: Conjunctivae normal.      Pupils: Pupils are equal, round, and reactive to light.  Cardiovascular:     Rate and Rhythm: Normal rate.  Pulmonary:     Effort: Pulmonary effort is normal. No respiratory distress.  Abdominal:     General: There is no distension.     Palpations: Abdomen is soft.  Genitourinary:    Comments: On the right side of the mons pubis there is an abscess.  It measures 3 cm across.  The top is open and there is purulent drainage. Musculoskeletal:        General: Normal range of motion.     Cervical back: Normal range of motion.  Skin:    General: Skin is warm and dry.  Neurological:     Mental Status: She is alert.      UC Treatments / Results  Labs (all labs ordered are listed, but only abnormal results are displayed) Labs Reviewed - No data to display  EKG   Radiology No results found.  Procedures Procedures (including critical care time)  Medications Ordered in UC Medications - No data to display  Initial Impression / Assessment and Plan / UC Course  I have reviewed the triage vital signs and the nursing notes.  Pertinent labs & imaging results that were available during my care of the patient were reviewed by me and considered in my medical decision making (see chart for details).     Since this is drained by itself I do not think it needs an I&D.  I did press on it and evacuated some of the contents.  Discussed home care Final Clinical Impressions(s) / UC Diagnoses   Final diagnoses:  Abscess     Discharge Instructions      Take the antibiotic 2 x a day TAKE WITH FOOD Take tylenol or ibuprofen for mild pain Take the hydrocodone for severe pain Do not drive on hydrocodone Call for problems   ED Prescriptions     Medication Sig Dispense Auth. Provider   doxycycline (VIBRAMYCIN) 100 MG capsule Take 1 capsule (100 mg total) by mouth 2 (two) times daily. 14 capsule Eustace Moore, MD   HYDROcodone-acetaminophen [facility]) 7.5-325 MG tablet Take 1 tablet  by mouth every 6 (six) hours as needed for moderate  pain. 8 tablet Eustace Moore, MD      I have reviewed the PDMP during this encounter.   Eustace Moore, MD 06/16/23 [phone]

## 2023-06-16 NOTE — Discharge Instructions (Addendum)
Take the antibiotic 2 x a day TAKE WITH FOOD Take tylenol or ibuprofen for mild pain Take the hydrocodone for severe pain Do not drive on hydrocodone Call for problems

## 2023-06-16 NOTE — ED Triage Notes (Signed)
Pt c/o ingrown hair abscess in RT sided groin area worsening in last couple days.

## 2023-07-17 ENCOUNTER — Telehealth: Payer: 59 | Admitting: Family Medicine

## 2023-07-17 DIAGNOSIS — M5440 Lumbago with sciatica, unspecified side: Secondary | ICD-10-CM

## 2023-07-17 MED ORDER — NAPROXEN 500 MG PO TABS: 500.0000 mg | ORAL_TABLET | Freq: Two times a day (BID) | ORAL | 0 refills | Status: DC

## 2023-07-17 MED ORDER — CYCLOBENZAPRINE HCL 10 MG PO TABS: 10.0000 mg | ORAL_TABLET | Freq: Three times a day (TID) | ORAL | 0 refills | Status: DC | PRN

## 2023-07-17 NOTE — Progress Notes (Signed)

## 2023-08-03 ENCOUNTER — Encounter: Payer: Self-pay | Admitting: Sports Medicine

## 2023-08-03 ENCOUNTER — Other Ambulatory Visit (INDEPENDENT_AMBULATORY_CARE_PROVIDER_SITE_OTHER): Payer: 59

## 2023-08-03 ENCOUNTER — Ambulatory Visit: Payer: 59 | Admitting: Sports Medicine

## 2023-08-03 DIAGNOSIS — G8929 Other chronic pain: Secondary | ICD-10-CM

## 2023-08-03 DIAGNOSIS — G5601 Carpal tunnel syndrome, right upper limb: Secondary | ICD-10-CM

## 2023-08-03 DIAGNOSIS — M5442 Lumbago with sciatica, left side: Secondary | ICD-10-CM

## 2023-08-03 DIAGNOSIS — M5126 Other intervertebral disc displacement, lumbar region: Secondary | ICD-10-CM

## 2023-08-03 MED ORDER — METHYLPREDNISOLONE 4 MG PO TBPK: ORAL_TABLET | ORAL | 0 refills | Status: DC

## 2023-08-03 NOTE — Progress Notes (Signed)
Patient says that she has had low back pain and numbness in the legs which has gotten notably worse in the last 4-5 weeks. She says that she had an MRI in 2021 with a DDD diagnosis; she says they recommended injections but that she did not have those. Patient says that her pain is constant, has gotten increasingly intense, and her numbness is constant, especially in the left leg. Patient says that she has numbness in her fingers that comes and goes and is typically worse in the mornings.

## 2023-08-03 NOTE — Progress Notes (Signed)
Kayla [name] - 42 y.o. female MRN [id number]  Date of birth: 1981-07-14  Office Visit Note: Visit Date: 08/03/2023 PCP: Zada Finders, PA-C Referred by: Zada Finders, PA-C  Subjective: Chief Complaint  Patient presents with   Lower Back - Pain   HPI: Kayla [name] is a pleasant 42 y.o. female who presents today for acute on chronic low back pain with left-sided sciatica. Also with R-hand/finger numbness and tingling. She works for [employer]  She has been dealing with chronic bilateral low back pain that radiates down the left leg for a number of years.  At times the pain will radiate down the right leg, but her left leg is consistent.  She was evaluated for this in the past and had an MRI back in 2019 which she brought in the report/screenshot today which showed a disc protrusion at the L5-S1 level with left-sided paracentral L5 nerve root impingement.  At that time, they recommended fluoroscopy guided injection, but she was not ready for this at the time.  She did get some improvement years ago from oral prednisone.  She currently is not doing any physical therapy.  She is using Flexeril 10 mg on her days off and at nighttime which does help some.  Also taking Naprosyn with some improvement.  Her pain worsens with extension and feels better with flexion.  The radiating pain goes over the anterior medial calf.  As well as into the toes.  Also notes numbness and tingling of her right hand, mostly in the thumb and index/middle finger.  Will need to shake this out at night.  Does not happen all the time but is typically worse at night or in the mornings.  *Note reviewed from Neurology from 01/27/22, Dr. Charyl Dancer (care everywhere) -history of complicated migraines, she is continuing Topamax 25 mg twice daily.  Pertinent ROS were reviewed with the patient and found to be negative unless otherwise specified above in HPI.   Assessment & Plan: Visit Diagnoses:  1. Chronic bilateral low  back pain with left-sided sciatica   2. Carpal tunnel syndrome, right upper limb   3. Protrusion of lumbar intervertebral disc    Plan: Discussed with Kayla [name] the nature of her acute on chronic low back pain with left-sided radiculopathy. Prior MRI in 2019 was reviewed today which showed disc protrusions, most notable at the L5-S1 level with central and paracentral herniation and L5 nerve impingement.  Her symptoms today do fit an L5 dermatome with numbness and tingling down the leg and over the anterior medial calf.  I do think she would benefit from fluoroscopy guided x-ray, but given that it has been 6 years since her previous MRI, we will order this again for further evaluation and management of her previous disc protrusions and any nerve impingement evaluation.  She may continue her stretching and exercises at home, but I would also like to get her started in formalized physical therapy, for the low back and to stabilize the posterior chain, referral was sent today.  Will start her on a 6-day Medrol Dosepak, she may then resume naproxen and her flexeril 10mg  at bedtime or PRN.  She also is dealing with right sided carpal tunnel syndrome, we discussed nighttime splinting and I printed her out a handout of a cock up wrist brace she may purchase over-the-counter to wear at nighttime. Curious if the Medrol Dosepak also alleviates her symptoms.  She will follow-up in 1 week after her MRI to  review and discuss next steps.  Follow-up: Return for f/u 1-week after MRI scan to review.   Meds & Orders:  Meds ordered this encounter  Medications   methylPREDNISolone (MEDROL DOSEPAK) 4 MG TBPK tablet    Sig: Take per packet instructions. Taper dosing.    Dispense:  1 each    Refill:  0    Orders Placed This Encounter  Procedures   XR Lumbar Spine Complete   MR Lumbar Spine w/o contrast   Ambulatory referral to Physical Therapy     Procedures: No procedures performed      Clinical History: No specialty  comments available.  She reports that she has been smoking cigarettes. She does not have any smokeless tobacco history on file. No results for input(s): "HGBA1C", "LABURIC" in the last 8760 hours.  Objective:    Physical Exam  Gen: Well-appearing, in no acute distress; non-toxic CV:  Well-perfused. Warm.  Resp: Breathing unlabored on room air; no wheezing. Psych: Fluid speech in conversation; appropriate affect; normal thought process Neuro: Sensation intact throughout. No gross coordination deficits.   Ortho Exam - Lumbar: There is some generalized tenderness in the midline as well as bilateral paraspinal musculature from the L3-S1 level.  There is full range of motion with flexion and extension although pain worsens with extension and feels slightly better with flexion.  Equivocal straight leg raise on the left, negative on the right.  There is 5/5 strength in bilateral lower extremity myotomes at the L4-S1 level.  - Right hand/wrist: No redness or swelling.  Negative Tinel sign at the carpal tunnel.  Positive Phalen's and reverse Phalen's test on the right.  Neurovascular intact distally.  Imaging: XR Lumbar Spine Complete  Result Date: 08/03/2023 Complete x-ray views of the lumbar spine including AP, lateral, flexion/extension views were ordered and reviewed by myself.  X-rays show 5 nonrib-bearing lumbar vertebrae.  There is some mild degenerative disc disease at the L5-S1 level with anterolisthesis of L5 on S1.  No acute bony fracture noted.   - MRI Lumbar spine w/o contrast 05/13/2018: Impression  1. Minimal right foraminal stenosis, mild left foraminal stenosis, and minimal central canal narrowing at L5-S1 due to a small broad-based posterior disc protrusion. This disc protrusion shows moderate cephalad extrusion along its left paracentral aspect where it contacts the left L5 nerve root within the left lateral recess above the level of the disc space. 2. Minimal bilateral foraminal  stenosis and mild central canal narrowing at L3-4 and L4-5 due to small broad-based posterior disc protrusions.  Danny Lawless, DO 05/13/2018 5:17 PM Narrative  MRI LUMBAR SPINE WITHOUT CONTRAST 05/13/2018.  INDICATION: Low back pain and left lower extremity pain and paresthesias for the past two weeks.  TECHNIQUE: Serial sagittal images were obtained of the patient's lumbar spine with selected serial axial images obtained from the T12-L1 disc space through the L5-S1 disc space. These were performed without gadolinium contrast material and viewed utilizing various T1 and T2 settings.  FINDINGS: Modic Type II degenerative changes within the vertebral body endplates adjacent to the L4-5 and L5-S1 disc spaces. Small benign hemangioma or focal benign interosseous fat within the L1 vertebral body. Degenerative disc desiccation within the L4-5 and L5-S1 disc spaces. Conus medullaris has a normal signal and position.  Incidental note of diffuse fatty infiltration of the liver.  T12-L1, L1-2, and L2-3: No significant central canal narrowing or foraminal stenosis.  L3-4: Small broad-based posterior disc protrusion causes minimal bilateral foraminal stenosis and mild central  canal narrowing.  L4-5: Small broad-based posterior disc protrusion causes minimal bilateral foraminal stenosis and mild central canal narrowing. This disc protrusion shows mild caudad extrusion along its central and bilateral paracentral aspects without evidence for disc fragment sequestration.  L5-S1: Small broad-based posterior disc protrusion causes minimal right foraminal stenosis, mild left foraminal stenosis, and minimal central canal narrowing. This disc protrusion shows moderate cephalad extrusion along its left paracentral aspect where it contacts the left L5 nerve root within the left lateral recess above the level of the disc space. No evidence for disc fragment sequestration.  Past Medical/Family/Surgical/Social  History: Medications & Allergies reviewed per EMR, new medications updated. Patient Active Problem List   Diagnosis Date Noted   History of CVA (cerebrovascular accident) 11/24/2021   Obesity, Class III, BMI 40-49.9 (morbid obesity) (HCC)    Facial paresthesia 10/25/2021   Essential hypertension 10/25/2021   Tobacco abuse 10/25/2021   Hypotension    TIA (transient ischemic attack)    Facial weakness    Empty sella (HCC)    History reviewed. No pertinent past medical history. Family History  Problem Relation Age of Onset   Diabetes Mother    Hypertension Mother    Past Surgical History:  Procedure Laterality Date   APPENDECTOMY     CHOLECYSTECTOMY     COLONOSCOPY     DILATION AND CURETTAGE OF UTERUS     ESOPHAGOGASTRODUODENOSCOPY     KNEE SURGERY     TEE WITHOUT CARDIOVERSION N/A 11/25/2021   Procedure: TRANSESOPHAGEAL ECHOCARDIOGRAM (TEE);  Surgeon: Laurier Nancy, MD;  Location: [facility] ORS;  Service: Cardiovascular;  Laterality: N/A;   TUBAL LIGATION     uterine ablation     Social History   Occupational History   Not on file  Tobacco Use   Smoking status: Some Days    Types: Cigarettes   Smokeless tobacco: Not on file  Substance and Sexual Activity   Alcohol use: Yes   Drug use: Not on file   Sexual activity: Yes    Birth control/protection: Surgical

## 2023-08-04 ENCOUNTER — Telehealth: Payer: Self-pay | Admitting: Sports Medicine

## 2023-08-04 NOTE — Telephone Encounter (Signed)
Patient called. Says the Flexeril wasn't called in to her pharmacy. Her cb# [phone]

## 2023-08-05 ENCOUNTER — Encounter: Payer: Self-pay | Admitting: Sports Medicine

## 2023-08-05 ENCOUNTER — Other Ambulatory Visit: Payer: Self-pay | Admitting: Sports Medicine

## 2023-08-05 MED ORDER — CYCLOBENZAPRINE HCL 10 MG PO TABS: 10.0000 mg | ORAL_TABLET | Freq: Three times a day (TID) | ORAL | 1 refills | Status: DC | PRN

## 2023-08-14 ENCOUNTER — Ambulatory Visit
Admission: RE | Admit: 2023-08-14 | Discharge: 2023-08-14 | Disposition: A | Discharge status: Home / Self Care | Payer: 59 | Source: Ambulatory Visit | Attending: Sports Medicine | Admitting: Sports Medicine

## 2023-08-14 DIAGNOSIS — M4316 Spondylolisthesis, lumbar region: Secondary | ICD-10-CM | POA: Diagnosis not present

## 2023-08-14 DIAGNOSIS — G8929 Other chronic pain: Secondary | ICD-10-CM

## 2023-08-14 DIAGNOSIS — M5126 Other intervertebral disc displacement, lumbar region: Secondary | ICD-10-CM | POA: Diagnosis not present

## 2023-08-16 NOTE — Therapy (Signed)
OUTPATIENT PHYSICAL THERAPY EVALUATION   Patient Name: Kayla [name] MRN: [id number] DOB:01-Jun-1981, 42 y.o., female Today's Date: 08/17/2023  END OF SESSION:  PT End of Session - 08/17/23 1508     Visit Number 1    Number of Visits 20    Date for PT Re-Evaluation 10/26/23    Authorization Type [facility] [employer] $25 copay    PT Start Time 1509    PT Stop Time 1543    PT Time Calculation (min) 34 min    Activity Tolerance Patient limited by pain    Behavior During Therapy [facility] for tasks assessed/performed             History reviewed. No pertinent past medical history. Past Surgical History:  Procedure Laterality Date   APPENDECTOMY     CHOLECYSTECTOMY     COLONOSCOPY     DILATION AND CURETTAGE OF UTERUS     ESOPHAGOGASTRODUODENOSCOPY     KNEE SURGERY     TEE WITHOUT CARDIOVERSION N/A 11/25/2021   Procedure: TRANSESOPHAGEAL ECHOCARDIOGRAM (TEE);  Surgeon: Laurier Nancy, MD;  Location: [facility] ORS;  Service: Cardiovascular;  Laterality: N/A;   TUBAL LIGATION     uterine ablation     Patient Active Problem List   Diagnosis Date Noted   History of CVA (cerebrovascular accident) 11/24/2021   Obesity, Class III, BMI 40-49.9 (morbid obesity) (HCC)    Facial paresthesia 10/25/2021   Essential hypertension 10/25/2021   Tobacco abuse 10/25/2021   Hypotension    TIA (transient ischemic attack)    Facial weakness    Empty sella (HCC)     PCP: Bunnie Domino PA-C  REFERRING PROVIDER: [name], DO  REFERRING DIAG: [phone] (ICD-10-CM) - Chronic bilateral low back pain with left-sided sciatica  Rationale for Evaluation and Treatment: Rehabilitation  THERAPY DIAG:  Other low back pain  Radiculopathy, lumbar region  Muscle weakness (generalized)  Difficulty in walking, not elsewhere classified  ONSET DATE: Acute on Chronic, worsened in last 2 months.   SUBJECTIVE:                                                                                                                                                                                            SUBJECTIVE STATEMENT: Acute on chronic complaints of back pain with Lt leg symptoms.  Pt indicated having numbness complaints into Lt lower leg in shin and side and middle toes.  Pt indicated having some off and on symptoms in Rt leg as well but Lt leg is constant.   Insidious onset of acute on chronic symptoms.   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  Indicated housecleaning troublesome at home.  Prolonged sitting can be more troublesome.  Pt indicated lying troublesome and impacting sleeping (takes medicine at night).  Prolonged walking/standing troublesome.   Had additional MRI performed (results not back yet)  PERTINENT HISTORY:  Concurrent carpal tunnel symptoms on Rt side.   PAIN:  NPRS scale: at worst 7/10, current 2/10 Pain location: lower back, Lt leg > Rt (shin anterior/lateral, middle toes for numbness).  Pain description: numbness/achy, constant Aggravating factors: prolonged positioning, lying down, sitting, walking.  Constant symptoms.  Relieving factors: medicine, changing positioning, leaning over counters at work.   PRECAUTIONS: None  WEIGHT BEARING RESTRICTIONS: No  FALLS:  Has patient fallen in last 6 months? No  LIVING ENVIRONMENT: Lives in: House/apartment  OCCUPATION: Nature conservation officer, 12 hr shifts, has missed work due to symptoms.   PLOF: Independent, previous has worked out but limited due to pain.    PATIENT GOALS: Reduce pain, "be normal"   OBJECTIVE:   IMAGING Prior MRI in 2019 was reviewed today which showed disc protrusions, most notable at the L5-S1 level with central and paracentral herniation and L5 nerve impingement.   PATIENT SURVEYS:  08/17/2023 FOTO eval: 43   predicted:  62  SCREENING FOR RED FLAGS: 08/17/2023 Bowel or bladder incontinence: No Cauda equina syndrome:  No  COGNITION: 08/17/2023 Overall cognitive status: WFL normal      SENSATION: Not tested  MUSCLE LENGTH: 08/17/2023 Passive slr Lt and Rt 90 degrees with back symptom increase at end range.  Unable to assess supine hamstring 90/90 due to pain in positioning as well as unable to assess piriformis mobility.   POSTURE:  08/17/2023 Iliac crest equal in stance, no lateral shift noted in standing posture.   PALPATION: 08/17/2023 Mild tenderness across lower lumbar.    LUMBAR ROM:  08/17/2023:  no clear centralization or peripheralization pattern in evaluation ROM today.   AROM 08/17/2023  Flexion To ankles with back pain  Extension 25 % WFL c back pain produced.  Repeated x 5, up to 50% with back pain but maybe less Rt posterior leg symptoms.   Right lateral flexion Mid shin with pulling Lt back  Left lateral flexion Mid shin with pulling Rt back  Right rotation   Left rotation    (Blank rows = not tested)  LOWER EXTREMITY ROM:      Right Eval passive Left Eval passive  Hip flexion Limited to 90 deg due to back pain Limited to 90 deg due to back pain  Hip extension    Hip abduction    Hip adduction    Hip internal rotation    Hip external rotation    Knee flexion    Knee extension    Ankle dorsiflexion    Ankle plantarflexion    Ankle inversion    Ankle eversion     (Blank rows = not tested)  LOWER EXTREMITY MMT:    MMT Right 08/17/2023 Left 08/17/2023  Hip flexion 5/5 5/5  Hip extension    Hip abduction    Hip adduction    Hip internal rotation    Hip external rotation    Knee flexion 5/5 5/5  Knee extension 5/5 4/5  Ankle dorsiflexion    Ankle plantarflexion    Ankle inversion    Ankle eversion     (Blank rows = not tested)  LUMBAR SPECIAL TESTS:  08/17/2023 (-) slump, crossed SLR bilateral   FUNCTIONAL TESTS:  08/17/2023 18  inch chair s UE assist on 1st try.   GAIT: 08/17/2023 Independent ambulation                                                                                                                                                                                                                    TODAY'S TREATMENT:                                                                                                         DATE: 08/17/2023  Therex:    HEP instruction/performance c cues for techniques, handout provided.  Trial set performed of each for comprehension and symptom assessment.  See below for exercise list  PATIENT EDUCATION:  08/17/2023 Education details: HEP, POC Person educated: Patient Education method: Explanation, Demonstration, Verbal cues, and Handouts Education comprehension: verbalized understanding, returned demonstration, and verbal cues required  HOME EXERCISE PROGRAM: Access Code: [email or web address] URL: [email or web address] Date: 08/17/2023 Prepared by: Chyrel Masson  Exercises - Supine Lower Trunk Rotation  - 2-3 x daily - 7 x weekly - 1 sets - 3-5 reps - 15 hold - Supine 90/90 Sciatic Nerve Glide with Knee Flexion/Extension  - 1-2 x daily - 7 x weekly - 1-2 sets - 10 reps - Standing Lumbar Extension with Counter  - 2-3 x daily - 7 x weekly - 1 sets - 5-10 reps - Supine Bridge  - 1-2 x daily - 7 x weekly - 1-2 sets - 10 reps - 2 hold  ASSESSMENT:  CLINICAL IMPRESSION: Patient is a 42 y.o. who comes to clinic with complaints of back pain with bilateral leg symptoms with mobility, strength and movement coordination deficits that impair their ability to perform usual daily and recreational functional activities without increase difficulty/symptoms at this time.  Patient to benefit from skilled PT services to address impairments and limitations to improve to previous level of function without restriction secondary to condition.   OBJECTIVE IMPAIRMENTS: decreased activity tolerance, decreased coordination, decreased endurance, decreased mobility, difficulty walking, decreased ROM,  decreased strength, hypomobility, increased fascial restrictions, impaired perceived functional ability,  increased muscle spasms, impaired flexibility, improper body mechanics, and pain.   ACTIVITY LIMITATIONS: carrying, lifting, bending, sitting, standing, squatting, sleeping, stairs, transfers, bed mobility, dressing, reach over head, and locomotion level  PARTICIPATION LIMITATIONS: meal prep, cleaning, laundry, interpersonal relationship, shopping, community activity, and occupation  PERSONAL FACTORS: Time since onset of injury/illness/exacerbation are also affecting patient's functional outcome.   REHAB POTENTIAL: Good  CLINICAL DECISION MAKING: Stable/uncomplicated  EVALUATION COMPLEXITY: Low   GOALS: Goals reviewed with patient? Yes  SHORT TERM GOALS: (target date for Short term goals are 3 weeks 09/07/2023)  1. Patient will demonstrate independent use of home exercise program to maintain progress from in clinic treatments.  Goal status: New  LONG TERM GOALS: (target dates for all long term goals are 10 weeks  10/26/2023 )   1. Patient will demonstrate/report pain at worst less than or equal to 2/10 to facilitate minimal limitation in daily activity secondary to pain symptoms.  Goal status: New   2. Patient will demonstrate independent use of home exercise program to facilitate ability to maintain/progress functional gains from skilled physical therapy services.  Goal status: New   3. Patient will demonstrate FOTO outcome > or = 64 % to indicate reduced disability due to condition.  Goal status: New   4. Patient will demonstrate lumbar extension 100 % WFL s symptoms to facilitate upright standing, walking posture at PLOF s limitation.  Goal status: New   5.  Patient will demonstrate bilateral LE MMT 5/5 to facilitate usual transfers, ambulation, daily activity at [facility] s limitation.   Goal status: New   6.  Patient will demonstrate bilateral hip flexion AROM > or = 110  deg to facilitate mobility s pain increase for daily activity.  Goal status: New   7.  Patient will demonstrate/report ability to sit, stand, walk > 30 mins s limitation to facilitate daily movement patterns with reduced symptoms.  Goal Status: New  PLAN:  PT FREQUENCY: 1-2x/week  PT DURATION: 10 weeks  PLANNED INTERVENTIONS: Can include 16109- PT Re-evaluation, 97110-Therapeutic exercises, 97530- Therapeutic activity, [id number]- Neuromuscular re-education, 97535- Self Care, 97140- Manual therapy, [phone]- Gait training, [phone]- Orthotic Fit/training, [phone]- Canalith repositioning, [id number]- Aquatic Therapy, 97014- Electrical stimulation (unattended), [id number]- Electrical stimulation (manual), [id number]- Vasopneumatic device, [id number]- Ultrasound, [id number]- Traction (mechanical), [id number]- Ionotophoresis 4mg /ml Dexamethasone, Patient/Family education, Balance training, Stair training, Taping, Dry Needling, Joint mobilization, Joint manipulation, Spinal manipulation, Spinal mobilization, Scar mobilization, Vestibular training, Visual/preceptual remediation/compensation, DME instructions, Cryotherapy, and Moist heat.  All performed as medically necessary.  All included unless contraindicated  PLAN FOR NEXT SESSION: Review HEP knowledge/results.  Recheck for directional preference response.  Recheck tolerance to piriformis related stretching.    Chyrel Masson, PT, DPT, OCS, ATC 08/17/23  3:51 PM

## 2023-08-17 ENCOUNTER — Other Ambulatory Visit: Payer: Self-pay

## 2023-08-17 ENCOUNTER — Ambulatory Visit: Payer: 59 | Admitting: Rehabilitative and Restorative Service Providers"

## 2023-08-17 ENCOUNTER — Ambulatory Visit: Payer: 59 | Admitting: Sports Medicine

## 2023-08-17 ENCOUNTER — Encounter: Payer: Self-pay | Admitting: Rehabilitative and Restorative Service Providers"

## 2023-08-17 DIAGNOSIS — M6281 Muscle weakness (generalized): Secondary | ICD-10-CM | POA: Diagnosis not present

## 2023-08-17 DIAGNOSIS — M5459 Other low back pain: Secondary | ICD-10-CM

## 2023-08-17 DIAGNOSIS — M5416 Radiculopathy, lumbar region: Secondary | ICD-10-CM | POA: Diagnosis not present

## 2023-08-17 DIAGNOSIS — R262 Difficulty in walking, not elsewhere classified: Secondary | ICD-10-CM | POA: Diagnosis not present

## 2023-08-24 ENCOUNTER — Ambulatory Visit: Payer: 59 | Admitting: Sports Medicine

## 2023-09-02 ENCOUNTER — Encounter: Payer: 59 | Admitting: Rehabilitative and Restorative Service Providers"

## 2023-09-02 ENCOUNTER — Ambulatory Visit: Payer: 59 | Admitting: Sports Medicine

## 2023-09-07 ENCOUNTER — Ambulatory Visit: Payer: 59 | Admitting: Sports Medicine

## 2023-09-08 ENCOUNTER — Encounter: Payer: 59 | Admitting: Rehabilitative and Restorative Service Providers"

## 2023-09-13 ENCOUNTER — Telehealth: Payer: Self-pay | Admitting: Sports Medicine

## 2023-09-13 NOTE — Telephone Encounter (Signed)
Patient called and needs a note stating that she isn't on any work restrictions. Can you put it her mychart. CB#[phone]

## 2023-09-14 ENCOUNTER — Encounter: Payer: 59 | Admitting: Rehabilitative and Restorative Service Providers"

## 2023-09-14 ENCOUNTER — Encounter: Payer: Self-pay | Admitting: Sports Medicine

## 2023-09-14 NOTE — Progress Notes (Signed)
Kayla [name] was seen and evaluated in my clinic on 08/03/2023 for her orthopedic conditions.  At this time, she currently has no physical restrictions for her work. She does have a follow-up appointment with me on 09/23/2023 to discuss next steps and treatments.  She is safe to perform her normal duties, if any questions please call the office.  [name], DO Primary Care Sports Medicine Physician  [facility] - Orthopedics  This note was dictated using Dragon naturally speaking software and may contain errors in syntax, spelling, or content which have not been identified prior to signing this note.

## 2023-09-21 ENCOUNTER — Encounter: Payer: 59 | Admitting: Rehabilitative and Restorative Service Providers"

## 2023-09-23 ENCOUNTER — Ambulatory Visit: Payer: 59 | Admitting: Sports Medicine

## 2023-11-13 ENCOUNTER — Ambulatory Visit: Payer: 59

## 2023-12-29 ENCOUNTER — Telehealth: Admitting: Physician Assistant

## 2023-12-29 DIAGNOSIS — B029 Zoster without complications: Secondary | ICD-10-CM

## 2023-12-29 MED ORDER — VALACYCLOVIR HCL 1 G PO TABS
1000.0000 mg | ORAL_TABLET | Freq: Three times a day (TID) | ORAL | 0 refills | Status: AC
Start: 2023-12-29 — End: 2024-01-05

## 2023-12-29 NOTE — Progress Notes (Signed)
E-visit for Shingles   We are sorry that you are not feeling well. Here is how we plan to help!  Based on what you shared with me it looks like you have shingles.  Shingles or herpes zoster, is a common infection of the nerves.  It is a painful rash caused by the herpes zoster virus.  This is the same virus that causes chickenpox.  After a person has chickenpox, the virus remains inactive in the nerve cells.  Years later, the virus can become active again and travel to the skin.  It typically will appear on one side of the face or body.  Burning or shooting pain, tingling, or itching are early signs of the infection.  Blisters typically scab over in 7 to 10 days and clear up within 2-4 weeks. Shingles is only contagious to people that have never had the chickenpox, the chickenpox vaccine, or anyone who has a compromised immune system.  You should avoid contact with these type of people until your blisters scab over.  I have prescribed Valacyclovir 1g three times daily for 7 days   HOME CARE: Apply ice packs (wrapped in a thin towel), cool compresses, or soak in cool bath to help reduce pain. Use calamine lotion to calm itchy skin. Avoid scratching the rash. Avoid direct sunlight.  GET HELP RIGHT AWAY IF: Symptoms that don't away after treatment. A rash or blisters near your eye. Increased drainage, fever, or rash after treatment. Severe pain that doesn't go away.   MAKE SURE YOU   Understand these instructions. Will watch your condition. Will get help right away if you are not doing well or get worse.  Thank you for choosing an e-visit.  Your e-visit answers were reviewed by a board certified advanced clinical practitioner to complete your personal care plan. Depending upon the condition, your plan could have included both over the counter or prescription medications.  Please review your pharmacy choice. Make sure the pharmacy is open so you can pick up prescription now. If there is  a problem, you may contact your provider through MyChart messaging and have the prescription routed to another pharmacy.  Your safety is important to us. If you have drug allergies check your prescription carefully.   For the next 24 hours you can use MyChart to ask questions about today's visit, request a non-urgent call back, or ask for a work or school excuse. You will get an email in the next two days asking about your experience. I hope that your e-visit has been valuable and will speed your recovery.  

## 2023-12-29 NOTE — Progress Notes (Signed)
 I have spent 5 minutes in review of e-visit questionnaire, review and updating patient chart, medical decision making and response to patient.   Piedad Climes, PA-C

## 2024-01-20 ENCOUNTER — Emergency Department (HOSPITAL_COMMUNITY)

## 2024-01-20 ENCOUNTER — Emergency Department (HOSPITAL_COMMUNITY): Admission: EM | Admit: 2024-01-20 | Discharge: 2024-01-20 | Disposition: A | Discharge status: Home / Self Care

## 2024-01-20 DIAGNOSIS — R29810 Facial weakness: Secondary | ICD-10-CM | POA: Diagnosis present

## 2024-01-20 DIAGNOSIS — G43809 Other migraine, not intractable, without status migrainosus: Secondary | ICD-10-CM | POA: Diagnosis not present

## 2024-01-20 LAB — COMPREHENSIVE METABOLIC PANEL WITH GFR
ALT: 22 U/L (ref 0–44)
AST: 18 U/L (ref 15–41)
Albumin: 3.6 g/dL (ref 3.5–5.0)
Alkaline Phosphatase: 57 U/L (ref 38–126)
Anion gap: 9 (ref 5–15)
BUN: 11 mg/dL (ref 6–20)
CO2: 23 mmol/L (ref 22–32)
Calcium: 9 mg/dL (ref 8.9–10.3)
Chloride: 106 mmol/L (ref 98–111)
Creatinine, Ser: 0.69 mg/dL (ref 0.44–1.00)
GFR, Estimated: >60 mL/min (ref >60)
Glucose, Bld: 106 mg/dL — ABNORMAL HIGH (ref 70–99)
Potassium: 3.6 mmol/L (ref 3.5–5.1)
Sodium: 138 mmol/L (ref 135–145)
Total Bilirubin: 0.8 mg/dL (ref 0.0–1.2)
Total Protein: 6.3 g/dL — ABNORMAL LOW (ref 6.5–8.1)

## 2024-01-20 LAB — URINALYSIS, ROUTINE W REFLEX MICROSCOPIC
Bilirubin Urine: NEGATIVE
Glucose, UA: NEGATIVE mg/dL
Ketones, ur: NEGATIVE mg/dL
Leukocytes,Ua: NEGATIVE
Nitrite: NEGATIVE
Protein, ur: NEGATIVE mg/dL
Specific Gravity, Urine: >1.03 — ABNORMAL HIGH (ref 1.005–1.030)
pH: 5.5 (ref 5.0–8.0)

## 2024-01-20 LAB — DIFFERENTIAL
Abs Immature Granulocytes: 0.04 10*3/uL (ref 0.00–0.07)
Basophils Absolute: 0 10*3/uL (ref 0.0–0.1)
Basophils Relative: 1 %
Eosinophils Absolute: 0.2 10*3/uL (ref 0.0–0.5)
Eosinophils Relative: 2 %
Immature Granulocytes: 1 %
Lymphocytes Relative: 36 %
Lymphs Abs: 2.7 10*3/uL (ref 0.7–4.0)
Monocytes Absolute: 0.5 10*3/uL (ref 0.1–1.0)
Monocytes Relative: 7 %
Neutro Abs: 4.1 10*3/uL (ref 1.7–7.7)
Neutrophils Relative %: 53 %

## 2024-01-20 LAB — URINALYSIS, MICROSCOPIC (REFLEX)

## 2024-01-20 LAB — CBC
HCT: 38.2 % (ref 36.0–46.0)
Hemoglobin: 12.4 g/dL (ref 12.0–15.0)
MCH: 29.7 pg (ref 26.0–34.0)
MCHC: 32.5 g/dL (ref 30.0–36.0)
MCV: 91.4 fL (ref 80.0–100.0)
Platelets: 302 10*3/uL (ref 150–400)
RBC: 4.18 MIL/uL (ref 3.87–5.11)
RDW: 13.1 % (ref 11.5–15.5)
WBC: 7.6 10*3/uL (ref 4.0–10.5)
nRBC: 0 % (ref 0.0–0.2)

## 2024-01-20 LAB — RAPID URINE DRUG SCREEN, HOSP PERFORMED
Amphetamines: NOT DETECTED
Barbiturates: NOT DETECTED
Benzodiazepines: NOT DETECTED
Cocaine: NOT DETECTED
Opiates: NOT DETECTED
Tetrahydrocannabinol: NOT DETECTED

## 2024-01-20 LAB — PROTIME-INR
INR: 1.1 (ref 0.8–1.2)
Prothrombin Time: 14.6 s (ref 11.4–15.2)

## 2024-01-20 LAB — ETHANOL: Alcohol, Ethyl (B): <10 mg/dL (ref <10)

## 2024-01-20 LAB — APTT: aPTT: 28 s (ref 24–36)

## 2024-01-20 LAB — HCG, SERUM, QUALITATIVE: Preg, Serum: NEGATIVE

## 2024-01-20 MED ORDER — PROCHLORPERAZINE EDISYLATE 10 MG/2ML IJ SOLN
10.0000 mg | Freq: Once | INTRAMUSCULAR | Status: AC
  Administered 2024-01-20: 10 mg via INTRAVENOUS
  Filled 2024-01-20: qty 2

## 2024-01-20 MED ORDER — ALPRAZOLAM 0.25 MG PO TABS
0.5000 mg | ORAL_TABLET | Freq: Once | ORAL | Status: AC
  Administered 2024-01-20: 0.5 mg via ORAL
  Filled 2024-01-20: qty 2

## 2024-01-20 MED ORDER — LORAZEPAM 1 MG PO TABS: 1.0000 mg | ORAL_TABLET | Freq: Once | ORAL | Status: DC

## 2024-01-20 MED ORDER — LORAZEPAM 2 MG/ML IJ SOLN
1.0000 mg | Freq: Once | INTRAMUSCULAR | Status: AC
  Administered 2024-01-20: 1 mg via INTRAVENOUS
  Filled 2024-01-20: qty 1

## 2024-01-20 MED ORDER — KETOROLAC TROMETHAMINE 15 MG/ML IJ SOLN
15.0000 mg | Freq: Once | INTRAMUSCULAR | Status: AC
  Administered 2024-01-20: 15 mg via INTRAVENOUS
  Filled 2024-01-20: qty 1

## 2024-01-20 NOTE — ED Notes (Signed)
 Pt understood d/c instructions and when to return to the ED. Follow up care reviewed. IV removed and pt left w/ family member to go home.

## 2024-01-20 NOTE — Discharge Instructions (Addendum)
 Please follow-up with your primary doctor and your neurologist.  Return immediately felt sudden onset headache, vision changes, facial droop, unilateral weakness, difficulty finding words, seizure-like activity, chest pain, shortness of breath, passout or you develop any new or worsening symptoms that are concerning to you.

## 2024-01-20 NOTE — ED Provider Notes (Signed)
 [facility] Provider Note   CSN: [id number] Arrival date & time: 01/20/24  6295     History  Chief Complaint  Patient presents with   Facial Droop    Kayla [name] is a 43 y.o. female.  This is a 43 year old female presents emergency department for right facial droop.  Last known normal 10 PM last night before she went to bed.  Awoke this morning at 5 AM.  Noted facial droop.  No headache, vision changes, dysarthria, aphasia, vertigo, balance disturbances.  Unilateral weakness,.  She does note some subjective tingling sensation to bilateral lower face.        Home Medications Prior to Admission medications   Not on File      Allergies    Eszopiclone, Levaquin [levofloxacin], and Sulfa antibiotics    Review of Systems   Review of Systems  Physical Exam Updated Vital Signs BP 128/78 (BP Location: Right Arm)   Pulse 78   Temp 98.1 F (36.7 C) (Oral)   Resp 14   SpO2 100%  Physical Exam Vitals and nursing note reviewed.  Constitutional:      General: She is not in acute distress.    Appearance: She is not toxic-appearing.  HENT:     Head: Normocephalic.     Nose: Nose normal.  Eyes:     Extraocular Movements: Extraocular movements intact.     Conjunctiva/sclera: Conjunctivae normal.  Cardiovascular:     Rate and Rhythm: Normal rate and regular rhythm.  Pulmonary:     Effort: Pulmonary effort is normal.     Breath sounds: Normal breath sounds.  Abdominal:     General: Abdomen is flat. There is no distension.     Palpations: Abdomen is soft.     Tenderness: There is no abdominal tenderness. There is no guarding or rebound.  Musculoskeletal:        General: Normal range of motion.     Cervical back: Normal range of motion.  Skin:    General: Skin is warm.     Capillary Refill: Capillary refill takes less than 2 seconds.  Neurological:     Mental Status: She is alert.     Comments: Patient with minor right lower  facial droop at rest.  Cranial nerves otherwise intact.  Equal strength in bilateral upper extremities.  No pronator drift.  Normal finger-to-nose and heel-to-shin.  No drift in lower extremities.  Psychiatric:        Mood and Affect: Mood normal.        Behavior: Behavior normal.     ED Results / Procedures / Treatments   Labs (all labs ordered are listed, but only abnormal results are displayed) Labs Reviewed  COMPREHENSIVE METABOLIC PANEL WITH GFR - Abnormal; Notable for the following components:      Result Value   Glucose, Bld 106 (*)    Total Protein 6.3 (*)    All other components within normal limits  URINALYSIS, ROUTINE W REFLEX MICROSCOPIC - Abnormal; Notable for the following components:   Specific Gravity, Urine >1.030 (*)    Hgb urine dipstick SMALL (*)    All other components within normal limits  URINALYSIS, MICROSCOPIC (REFLEX) - Abnormal; Notable for the following components:   Bacteria, UA RARE (*)    All other components within normal limits  ETHANOL  PROTIME-INR  APTT  CBC  DIFFERENTIAL  RAPID URINE DRUG SCREEN, HOSP PERFORMED  HCG, SERUM, QUALITATIVE    EKG  None  Radiology MR BRAIN WO CONTRAST Result Date: 01/20/2024 CLINICAL DATA:  Stroke follow-up.  Right facial droop. EXAM: MRI HEAD WITHOUT CONTRAST TECHNIQUE: Multiplanar, multiecho pulse sequences of the brain and surrounding structures were obtained without intravenous contrast. COMPARISON:  Head MRI 11/24/2021 FINDINGS: Brain: There is no evidence of an acute infarct, intracranial hemorrhage, mass, midline shift, or extra-axial fluid collection. Cerebral volume is normal. The ventricles are normal in size. 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 There is no cerebellar tonsillar ectopia. Brain is normal in signal. Vascular: Major intracranial vascular flow voids are preserved. Skull and upper cervical spine: Unremarkable bone marrow signal. Sinuses/Orbits: Unchanged prominent CSF within the optic nerve  sheaths bilaterally. Paranasal sinuses and mastoid air cells are clear. Other: None. IMPRESSION: 1. No acute intracranial abnormality. 2. Unchanged partially empty sella and prominent CSF within the optic nerve sheaths which can be seen with idiopathic intracranial hypertension. Electronically Signed   By: Sebastian Ache M.D.   On: 01/20/2024 12:20    Procedures Procedures    Medications Ordered in ED Medications  ALPRAZolam (XANAX) tablet 0.5 mg (0.5 mg Oral Given 01/20/24 1057)  LORazepam (ATIVAN) injection 1 mg (1 mg Intravenous Given 01/20/24 1200)  ketorolac (TORADOL) 15 MG/ML injection 15 mg (15 mg Intravenous Given 01/20/24 1309)  prochlorperazine (COMPAZINE) injection 10 mg (10 mg Intravenous Given 01/20/24 1314)    ED Course/ Medical Decision Making/ A&P Clinical Course as of 01/20/24 1638  Thu Jan 20, 2024  1232 MR BRAIN WO CONTRAST IMPRESSION: 1. No acute intracranial abnormality. 2. Unchanged partially empty sella and prominent CSF within the optic nerve sheaths which can be seen with idiopathic intracranial hypertension.   [TY]  1501 Workup largely reassuring.  No fever tachycardia leukocytosis to suggest systemic infection.  No anemia.  No significant metabolic derangements that would explain her presentation today.  Urine drug screen ethanol level negative.  UA without evidence of UTI.  Pregnancy test negative.  MRI without evidence of stroke.  Treated with migraine cocktail resolution of her symptoms.  Per chart review has had TIA workup in the past which was negative.  Last neurology note seemingly with complex migraine as leading diagnosis.  Given resolution of symptoms feel that she is stable for discharge and outpatient follow-up. [TY]    Clinical Course User Index [TY] Coral Spikes, DO                                 Medical Decision Making 43 year old female presenting emergency department for facial droop.  Reportedly has history of TIA with a negative workup several  years ago.  She is afebrile nontachycardic hemodynamically stable.  Some minor facial droop to the right face.  Considered Bell's palsy, however forehead is spared.  Outside of the window for TNK as her last known normal was 10 PM last night.  Van negative.  Low suspicion for LVO.  Per chart review does have history of complex migraines with facial droop.  Will get MRI to exclude stroke.  See ED course for further MDM/disposition.  Will trial Reglan cocktail if MRI negative.  Amount and/or Complexity of Data Reviewed External Data Reviewed:     Details: See ED course Labs: ordered. Decision-making details documented in ED Course. Radiology: ordered. Decision-making details documented in ED Course.  Risk Prescription drug management. Decision regarding hospitalization.          Final Clinical Impression(s) / ED Diagnoses  Final diagnoses:  Other migraine without status migrainosus, not intractable    Rx / DC Orders ED Discharge Orders          Ordered    Ambulatory referral to Neurology       Comments: An appointment is requested in approximately: 2 weeks   01/20/24 1505              Coral Spikes, DO 01/20/24 1638

## 2024-01-20 NOTE — ED Triage Notes (Signed)
 Pt arrived POV w/ c/o facial droop. R sided facial droop that started @0500 . Has  been previous dx w/ TIA as well as hospitalization for stroke w/ TKA given at that time. Denies hemiparalysis, HA, vision changes, numbness/tingling. Pt is A&O x4

## 2024-08-21 ENCOUNTER — Encounter: Payer: Self-pay | Admitting: Radiology
